# Patient Record
Sex: Female | Born: 1940 | Race: White | Hispanic: No | Smoking: Never smoker
Health system: Southern US, Community
[De-identification: ages and names within clinical notes are randomized; demographics above are authoritative.]

## PROBLEM LIST (undated history)

## (undated) DIAGNOSIS — M5116 Intervertebral disc disorders with radiculopathy, lumbar region: Secondary | ICD-10-CM

## (undated) DIAGNOSIS — I10 Essential (primary) hypertension: Secondary | ICD-10-CM

## (undated) DIAGNOSIS — H348112 Central retinal vein occlusion, right eye, stable: Secondary | ICD-10-CM

## (undated) DIAGNOSIS — K219 Gastro-esophageal reflux disease without esophagitis: Secondary | ICD-10-CM

## (undated) DIAGNOSIS — E039 Hypothyroidism, unspecified: Secondary | ICD-10-CM

## (undated) DIAGNOSIS — C801 Malignant (primary) neoplasm, unspecified: Secondary | ICD-10-CM

## (undated) DIAGNOSIS — R911 Solitary pulmonary nodule: Secondary | ICD-10-CM

## (undated) HISTORY — PX: BREAST EXCISIONAL BIOPSY: SUR124

## (undated) HISTORY — PX: COLONOSCOPY: SHX174

## (undated) HISTORY — PX: ESOPHAGOGASTRODUODENOSCOPY: SHX1529

## (undated) SURGERY — Surgical Case
Anesthesia: *Unknown

---

## 1988-12-31 HISTORY — PX: BREAST BIOPSY: SHX20

## 2005-06-25 ENCOUNTER — Ambulatory Visit: Payer: Self-pay | Admitting: Unknown Physician Specialty

## 2005-10-12 ENCOUNTER — Emergency Department: Payer: Self-pay | Admitting: General Practice

## 2012-12-31 HISTORY — PX: VIDEO ASSISTED THORACOSCOPY (VATS)/WEDGE RESECTION: SHX6174

## 2013-09-23 ENCOUNTER — Ambulatory Visit: Payer: Self-pay | Admitting: Oncology

## 2013-09-30 ENCOUNTER — Ambulatory Visit: Payer: Self-pay | Admitting: Oncology

## 2013-11-03 ENCOUNTER — Ambulatory Visit: Payer: Self-pay | Admitting: Oncology

## 2013-11-05 ENCOUNTER — Ambulatory Visit: Payer: Self-pay | Admitting: Oncology

## 2013-11-30 ENCOUNTER — Ambulatory Visit: Payer: Self-pay | Admitting: Oncology

## 2013-12-31 DIAGNOSIS — C801 Malignant (primary) neoplasm, unspecified: Secondary | ICD-10-CM

## 2013-12-31 HISTORY — DX: Malignant (primary) neoplasm, unspecified: C80.1

## 2015-07-25 DIAGNOSIS — M5116 Intervertebral disc disorders with radiculopathy, lumbar region: Secondary | ICD-10-CM

## 2015-07-25 HISTORY — DX: Intervertebral disc disorders with radiculopathy, lumbar region: M51.16

## 2016-04-02 ENCOUNTER — Ambulatory Visit
Admission: RE | Admit: 2016-04-02 | Payer: Medicare Other | Source: Ambulatory Visit | Admitting: Unknown Physician Specialty

## 2016-04-02 ENCOUNTER — Encounter: Admission: RE | Payer: Self-pay | Source: Ambulatory Visit

## 2016-04-02 SURGERY — COLONOSCOPY WITH PROPOFOL
Anesthesia: General

## 2016-05-09 ENCOUNTER — Encounter: Payer: Self-pay | Admitting: *Deleted

## 2016-05-10 ENCOUNTER — Ambulatory Visit
Admission: RE | Admit: 2016-05-10 | Discharge: 2016-05-10 | Disposition: A | Discharge status: Home / Self Care | Payer: Medicare Other | Source: Ambulatory Visit | Attending: Unknown Physician Specialty | Admitting: Unknown Physician Specialty

## 2016-05-10 ENCOUNTER — Encounter: Payer: Self-pay | Admitting: *Deleted

## 2016-05-10 ENCOUNTER — Ambulatory Visit: Payer: Medicare Other | Admitting: Anesthesiology

## 2016-05-10 ENCOUNTER — Encounter
Admission: RE | Disposition: A | Discharge status: Home / Self Care | Payer: Self-pay | Source: Ambulatory Visit | Attending: Unknown Physician Specialty

## 2016-05-10 DIAGNOSIS — K219 Gastro-esophageal reflux disease without esophagitis: Secondary | ICD-10-CM | POA: Insufficient documentation

## 2016-05-10 DIAGNOSIS — D123 Benign neoplasm of transverse colon: Secondary | ICD-10-CM | POA: Diagnosis not present

## 2016-05-10 DIAGNOSIS — Z7982 Long term (current) use of aspirin: Secondary | ICD-10-CM | POA: Insufficient documentation

## 2016-05-10 DIAGNOSIS — Z1211 Encounter for screening for malignant neoplasm of colon: Secondary | ICD-10-CM | POA: Diagnosis not present

## 2016-05-10 DIAGNOSIS — Z79899 Other long term (current) drug therapy: Secondary | ICD-10-CM | POA: Diagnosis not present

## 2016-05-10 DIAGNOSIS — I10 Essential (primary) hypertension: Secondary | ICD-10-CM | POA: Diagnosis not present

## 2016-05-10 DIAGNOSIS — K640 First degree hemorrhoids: Secondary | ICD-10-CM | POA: Insufficient documentation

## 2016-05-10 DIAGNOSIS — K621 Rectal polyp: Secondary | ICD-10-CM | POA: Insufficient documentation

## 2016-05-10 DIAGNOSIS — K5790 Diverticulosis of intestine, part unspecified, without perforation or abscess without bleeding: Secondary | ICD-10-CM | POA: Diagnosis not present

## 2016-05-10 DIAGNOSIS — E039 Hypothyroidism, unspecified: Secondary | ICD-10-CM | POA: Insufficient documentation

## 2016-05-10 HISTORY — DX: Gastro-esophageal reflux disease without esophagitis: K21.9

## 2016-05-10 HISTORY — DX: Intervertebral disc disorders with radiculopathy, lumbar region: M51.16

## 2016-05-10 HISTORY — PX: COLONOSCOPY WITH PROPOFOL: SHX5780

## 2016-05-10 HISTORY — DX: Essential (primary) hypertension: I10

## 2016-05-10 HISTORY — DX: Solitary pulmonary nodule: R91.1

## 2016-05-10 HISTORY — DX: Hypothyroidism, unspecified: E03.9

## 2016-05-10 HISTORY — DX: Central retinal vein occlusion, right eye, stable: H34.8112

## 2016-05-10 SURGERY — COLONOSCOPY WITH PROPOFOL
Anesthesia: General

## 2016-05-10 MED ORDER — PROPOFOL 10 MG/ML IV BOLUS
INTRAVENOUS | Status: DC | PRN
  Administered 2016-05-10: 80 mg via INTRAVENOUS

## 2016-05-10 MED ORDER — SODIUM CHLORIDE 0.9 % IV SOLN: INTRAVENOUS | Status: DC

## 2016-05-10 MED ORDER — FENTANYL CITRATE (PF) 100 MCG/2ML IJ SOLN
INTRAMUSCULAR | Status: DC | PRN
  Administered 2016-05-10: 50 ug via INTRAVENOUS

## 2016-05-10 MED ORDER — SODIUM CHLORIDE 0.9 % IV SOLN
INTRAVENOUS | Status: DC
  Administered 2016-05-10: 10:00:00 via INTRAVENOUS

## 2016-05-10 MED ORDER — LIDOCAINE HCL (CARDIAC) 20 MG/ML IV SOLN
INTRAVENOUS | Status: DC | PRN
  Administered 2016-05-10: 30 mg via INTRAVENOUS

## 2016-05-10 MED ORDER — MIDAZOLAM HCL 5 MG/5ML IJ SOLN
INTRAMUSCULAR | Status: DC | PRN
  Administered 2016-05-10: 1 mg via INTRAVENOUS

## 2016-05-10 MED ORDER — PROPOFOL 500 MG/50ML IV EMUL
INTRAVENOUS | Status: DC | PRN
  Administered 2016-05-10: 160 ug/kg/min via INTRAVENOUS

## 2016-05-10 NOTE — H&P (Signed)
Primary Care Physician:  Lenard Simmer, MD Primary Gastroenterologist:  Dr. Vira Agar  Pre-Procedure History & Physical: HPI:  Mckenzie Norton is a 76 y.o. female is here for an colonoscopy.   Past Medical History  Diagnosis Date  . 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 Hypertension   . Hypothyroidism   . Solitary pulmonary nodule   . GERD (gastroesophageal reflux disease)   . 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 Esophagogastroduodenoscopy    . Colonoscopy    . Video assisted thoracoscopy (vats)/wedge resection Left 2014    mature carcinoid tumor of the LUL    Prior to Admission medications   Medication Sig Start Date End Date Taking? Authorizing Provider  aspirin 325 MG tablet Take 325 mg by mouth daily.   Yes Historical Provider, MD  Calcium Carbonate-Vitamin D (CALTRATE 600+D PO) Take 600 mg by mouth 3 (three) times daily.   Yes Historical Provider, MD  esomeprazole (NEXIUM) 40 MG capsule Take 40 mg by mouth daily at 12 noon.   Yes Historical Provider, MD  levothyroxine (SYNTHROID, LEVOTHROID) 75 MCG tablet Take 75 mcg by mouth daily before breakfast.   Yes Historical Provider, MD  losartan-hydrochlorothiazide (HYZAAR) 50-12.5 MG tablet Take 1 tablet by mouth daily.   Yes Historical Provider, MD  metoprolol succinate (TOPROL-XL) 25 MG 24 hr tablet Take 12.5 mg by mouth daily.    Yes Historical Provider, MD  Multiple Vitamins-Minerals (CENTRUM SILVER PO) Take by mouth.   Yes Historical Provider, MD  omega-3 acid ethyl esters (LOVAZA) 1 g capsule Take 1 g by mouth 2 (two) times daily.   Yes Historical Provider, MD  rosuvastatin (CRESTOR) 10 MG tablet Take 10 mg by mouth daily.   Yes Historical Provider, MD    Allergies as of 04/30/2016  . (Not on File)    History reviewed. No pertinent family history.  Social History   Social History  . Marital Status: Married   Spouse Name: N/A  . Number of Children: N/A  . Years of Education: N/A   Occupational History  . Not on file.   Social History Main Topics  . Smoking status: Never Smoker   . Smokeless tobacco: Not on file  . Alcohol Use: 0.0 oz/week    0 Glasses of wine per week  . Drug Use: No  . Sexual Activity: Not on file   Other Topics Concern  . Not on file   Social History Narrative    Review of Systems: See HPI, otherwise negative ROS  Physical Exam: BP 123/60 mmHg  Pulse 71  Temp(Src) 97.4 F (36.3 C) (Tympanic)  Resp 16  Ht 5' (1.524 m)  Wt 70.308 kg (155 lb)  BMI 30.27 kg/m2  SpO2 100% General:   Alert,  pleasant and cooperative in NAD Head:  Normocephalic and atraumatic. Neck:  Supple; no masses or thyromegaly. Lungs:  Clear throughout to auscultation.    Heart:  Regular rate and rhythm. Abdomen:  Soft, nontender and nondistended. Normal bowel sounds, without guarding, and without rebound.   Neurologic:  Alert and  oriented x4;  grossly normal neurologically.  Impression/Plan: Mckenzie Norton is here for an colonoscopy to be performed for screening  Risks, benefits, limitations, and alternatives regarding  colonoscopy have been reviewed with the patient.  Questions have been answered.  All parties agreeable.   Gaylyn Cheers, MD  05/10/2016, 10:16 AM

## 2016-05-10 NOTE — Anesthesia Postprocedure Evaluation (Signed)
Anesthesia Post Note  Patient: Mckenzie Norton  Procedure(s) Performed: Procedure(s) (LRB): COLONOSCOPY WITH PROPOFOL (N/A)  Patient location during evaluation: Endoscopy Anesthesia Type: General Level of consciousness: awake and alert Pain management: pain level controlled Vital Signs Assessment: post-procedure vital signs reviewed and stable Respiratory status: spontaneous breathing, nonlabored ventilation, respiratory function stable and patient connected to nasal cannula oxygen Cardiovascular status: blood pressure returned to baseline and stable Postop Assessment: no signs of nausea or vomiting Anesthetic complications: no    Last Vitals:  Filed Vitals:   05/10/16 1100 05/10/16 1110  BP: 109/63 125/68  Pulse: 69 64  Temp:    Resp: 16 13    Last Pain: There were no vitals filed for this visit.               Martha Clan

## 2016-05-10 NOTE — Op Note (Signed)
[facility] Patient Name: Mckenzie Norton Procedure Date: 05/10/2016 10:13 AM MRN: [id number] Account #: [id number] Date of Birth: [birth year] Admit Type: Outpatient Age: 76 Room: [facility] ENDO ROOM 1 Gender: Female Note Status: Finalized Procedure:            Colonoscopy Indications:          Screening for colorectal malignant neoplasm Providers:            Manya Silvas, MD Referring MD:         Lenard Simmer, MD (Referring MD) Medicines:            Propofol per Anesthesia Complications:        No immediate complications. Procedure:            Pre-Anesthesia Assessment:                       - After reviewing the risks and benefits, the patient                        was deemed in satisfactory condition to undergo the                        procedure.                       After obtaining informed consent, the colonoscope was                        passed under direct vision. Throughout the procedure,                        the patient's blood pressure, pulse, and oxygen                        saturations were monitored continuously. The                        Colonoscope was introduced through the anus and                        advanced to the the cecum, identified by appendiceal                        orifice and ileocecal valve. The colonoscopy was                        performed without difficulty. The patient tolerated the                        procedure well. The quality of the bowel preparation                        was excellent. Findings:      Two sessile polyps were found in the rectum and transverse colon. The       polyps were diminutive in size. These polyps were removed with a jumbo       cold forceps. Resection and retrieval were complete.      Internal hemorrhoids were found during endoscopy. The hemorrhoids were       small and Grade I (internal hemorrhoids that do not prolapse).  A few small-mouthed diverticula were  found in the sigmoid colon.      The exam was otherwise without abnormality. Impression:           - Two diminutive polyps in the rectum and in the                        transverse colon, removed with a jumbo cold forceps.                        Resected and retrieved.                       - Internal hemorrhoids.                       - Diverticulosis in the sigmoid colon.                       - The examination was otherwise normal. Recommendation:       - Await pathology results. Manya Silvas, MD 05/10/2016 10:40:06 AM This report has been signed electronically. Number of Addenda: 0 Note Initiated On: 05/10/2016 10:13 AM Scope Withdrawal Time: 0 hours 8 minutes 55 seconds  Total Procedure Duration: 0 hours 14 minutes 16 seconds       [facility]

## 2016-05-10 NOTE — Anesthesia Preprocedure Evaluation (Signed)
Anesthesia Evaluation  Patient identified by MRN, date of birth, ID band Patient awake    Reviewed: Allergy & Precautions, H&P , NPO status , Patient's Chart, lab work & pertinent test results, reviewed documented beta blocker date and time   History of Anesthesia Complications Negative for: history of anesthetic complications  Airway Mallampati: II  TM Distance: >3 FB Neck ROM: full    Dental no notable dental hx. (+) Caps, Teeth Intact, Missing   Pulmonary neg pulmonary ROS,    Pulmonary exam normal breath sounds clear to auscultation       Cardiovascular Exercise Tolerance: Good hypertension, On Medications (-) angina(-) CAD, (-) Past MI, (-) Cardiac Stents and (-) CABG Normal cardiovascular exam(-) dysrhythmias (-) Valvular Problems/Murmurs Rhythm:regular Rate:Normal     Neuro/Psych negative neurological ROS  negative psych ROS   GI/Hepatic Neg liver ROS, GERD  ,  Endo/Other  neg diabetesHypothyroidism   Renal/GU negative Renal ROS  negative genitourinary   Musculoskeletal   Abdominal   Peds  Hematology negative hematology ROS (+)   Anesthesia Other Findings Past Medical History:   Intervertebral disc disorder with radiculopath* 07/25/15      Hypertension                                                 Hypothyroidism                                               Solitary pulmonary nodule                                    GERD (gastroesophageal reflux disease)                       Reproductive/Obstetrics negative OB ROS                             Anesthesia Physical Anesthesia Plan  ASA: II  Anesthesia Plan: General   Post-op Pain Management:    Induction:   Airway Management Planned:   Additional Equipment:   Intra-op Plan:   Post-operative Plan:   Informed Consent: I have reviewed the patients History and Physical, chart, labs and discussed the procedure including  the risks, benefits and alternatives for the proposed anesthesia with the patient or authorized representative who has indicated his/her understanding and acceptance.   Dental Advisory Given  Plan Discussed with: Anesthesiologist, CRNA and Surgeon  Anesthesia Plan Comments:         Anesthesia Quick Evaluation

## 2016-05-10 NOTE — Transfer of Care (Signed)
Immediate Anesthesia Transfer of Care Note  Patient: Mckenzie Norton  Procedure(s) Performed: Procedure(s): COLONOSCOPY WITH PROPOFOL (N/A)  Patient Location: PACU and Endoscopy Unit  Anesthesia Type:General  Level of Consciousness: sedated and patient cooperative  Airway & Oxygen Therapy: Patient Spontanous Breathing and Patient connected to nasal cannula oxygen  Post-op Assessment: Report given to RN and Post -op Vital signs reviewed and stable  Post vital signs: Reviewed and stable  Last Vitals:  Filed Vitals:   05/10/16 0941  BP: 123/60  Pulse: 71  Temp: 36.3 C  Resp: 16    Last Pain: There were no vitals filed for this visit.       Complications: No apparent anesthesia complications

## 2016-05-11 LAB — SURGICAL PATHOLOGY

## 2016-05-12 ENCOUNTER — Encounter: Payer: Self-pay | Admitting: Unknown Physician Specialty

## 2017-08-03 ENCOUNTER — Encounter: Payer: Self-pay | Admitting: Gynecology

## 2017-08-03 ENCOUNTER — Ambulatory Visit
Admission: EM | Admit: 2017-08-03 | Discharge: 2017-08-03 | Disposition: A | Discharge status: Home / Self Care | Payer: Medicare Other | Attending: Family Medicine | Admitting: Family Medicine

## 2017-08-03 DIAGNOSIS — Z7982 Long term (current) use of aspirin: Secondary | ICD-10-CM | POA: Diagnosis not present

## 2017-08-03 DIAGNOSIS — Z79899 Other long term (current) drug therapy: Secondary | ICD-10-CM | POA: Diagnosis not present

## 2017-08-03 DIAGNOSIS — I10 Essential (primary) hypertension: Secondary | ICD-10-CM | POA: Diagnosis not present

## 2017-08-03 DIAGNOSIS — E039 Hypothyroidism, unspecified: Secondary | ICD-10-CM | POA: Insufficient documentation

## 2017-08-03 DIAGNOSIS — K219 Gastro-esophageal reflux disease without esophagitis: Secondary | ICD-10-CM | POA: Diagnosis not present

## 2017-08-03 DIAGNOSIS — R300 Dysuria: Secondary | ICD-10-CM

## 2017-08-03 DIAGNOSIS — N390 Urinary tract infection, site not specified: Secondary | ICD-10-CM | POA: Diagnosis present

## 2017-08-03 DIAGNOSIS — Z881 Allergy status to other antibiotic agents status: Secondary | ICD-10-CM | POA: Diagnosis not present

## 2017-08-03 LAB — URINALYSIS, COMPLETE (UACMP) WITH MICROSCOPIC: Squamous Epithelial / LPF: NONE SEEN

## 2017-08-03 MED ORDER — CEPHALEXIN 500 MG PO CAPS: 500.0000 mg | ORAL_CAPSULE | Freq: Two times a day (BID) | ORAL | 0 refills | Status: AC

## 2017-08-03 NOTE — ED Triage Notes (Signed)
Patient c/o frequent and painful urination.

## 2017-08-03 NOTE — ED Provider Notes (Signed)
[facility] ____________________________________________  Time seen: Approximately 10:19 AM  I have reviewed the triage vital signs and the nursing notes.   HISTORY  Chief Complaint Urinary Tract Infection   HPI Mckenzie Norton is a 77 y.o. female presenting for evaluation of frequent urination and burning with urination that has been present for the last 2 days. Patient reports she did take over-the-counter urinary burning released medications which did help with symptoms. Reports that she does have some baseline urinary incontinence and uses painting liners, denies any acute changes in this. Reports has had some loose stool yesterday, denies continued loose stool, denies hematochezia, melena or abnormal colors stool. Reports some lower abdominal pressure and cramping sensation, but reports of an otherwise feels well. Reports has continued to eat and drink well. Denies attempting fevers, back pain, nausea, vomiting or other complaints. Reports does feel similar to previous urinary tract infections. Denies any recent UTI. Denies any vaginal complaints. Denies known trigger.Denies chest pain, shortness of breath, abdominal pain, dysuria, extremity pain, extremity swelling or rash. Denies recent sickness. Denies recent antibiotic use.   Morayati, Lourdes Sledge, MD: PCP   Past Medical History:  Diagnosis Date  . 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 GERD (gastroesophageal reflux disease)   . Hypertension   . Hypothyroidism   . 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 Solitary pulmonary nodule     There are no active problems to display for this patient.   Past Surgical History:  Procedure Laterality Date  . COLONOSCOPY    . COLONOSCOPY WITH PROPOFOL N/A 05/10/2016   Procedure: COLONOSCOPY WITH PROPOFOL;  Surgeon: Manya Silvas, MD;  Location[facility] ENDOSCOPY;  Service: Endoscopy;  Laterality: N/A;  . ESOPHAGOGASTRODUODENOSCOPY     . VIDEO ASSISTED THORACOSCOPY (VATS)/WEDGE RESECTION Left 2014   mature carcinoid tumor of the LUL     No current facility-administered medications for this encounter.   Current Outpatient Prescriptions:  .  aspirin 325 MG tablet, Take 325 mg by mouth daily., Disp: , Rfl:  .  Calcium Carbonate-Vitamin D (CALTRATE 600+D PO), Take 600 mg by mouth 3 (three) times daily., Disp: , Rfl:  .  esomeprazole (NEXIUM) 40 MG capsule, Take 40 mg by mouth daily at 12 noon., Disp: , Rfl:  .  levothyroxine (SYNTHROID, LEVOTHROID) 75 MCG tablet, Take 75 mcg by mouth daily before breakfast., Disp: , Rfl:  .  losartan-hydrochlorothiazide (HYZAAR) 50-12.5 MG tablet, Take 1 tablet by mouth daily., Disp: , Rfl:  .  metoprolol succinate (TOPROL-XL) 25 MG 24 hr tablet, Take 12.5 mg by mouth daily. , Disp: , Rfl:  .  Multiple Vitamins-Minerals (CENTRUM SILVER PO), Take by mouth., Disp: , Rfl:  .  omega-3 acid ethyl esters (LOVAZA) 1 g capsule, Take 1 g by mouth 2 (two) times daily., Disp: , Rfl:  .  rosuvastatin (CRESTOR) 10 MG tablet, Take 10 mg by mouth daily., Disp: , Rfl:  .  cephALEXin (KEFLEX) 500 MG capsule, Take 1 capsule (500 mg total) by mouth 2 (two) times daily., Disp: 14 capsule, Rfl: 0  Allergies Levofloxacin and Adhesive [tape]  No family history on file.  Social History Social History  Substance Use Topics  . Smoking status: Never Smoker  . Smokeless tobacco: Never Used  . Alcohol use 0.0 oz/week    Review of Systems Constitutional: No fever/chills Cardiovascular: Denies chest pain. Respiratory: Denies shortness of breath. Gastrointestinal: No abdominal pain.  No nausea, no vomiting.  Genitourinary: Positive for dysuria. Musculoskeletal: Negative for back pain. Skin: Negative for rash.  ____________________________________________   PHYSICAL EXAM:  VITAL SIGNS: ED Triage Vitals  Enc Vitals Group     BP 08/03/17 0907 113/63     Pulse Rate 08/03/17 0907 63     Resp 08/03/17  0907 16     Temp 08/03/17 0907 97.8 F (36.6 C)     Temp Source 08/03/17 0907 Oral     SpO2 08/03/17 0907 98 %     Weight 08/03/17 0912 153 lb (69.4 kg)     Height 08/03/17 0912 5' (1.524 m)     Head Circumference --      Peak Flow --      Pain Score 08/03/17 0912 3     Pain Loc --      Pain Edu? --      Excl. in [place]? --     Constitutional: Alert and oriented. Well appearing and in no acute distress.. Cardiovascular: Normal rate, regular rhythm. Grossly normal heart sounds.  Good peripheral circulation. Respiratory: Normal respiratory effort without tachypnea nor retractions. Breath sounds are clear and equal bilaterally. No wheezes, rales, rhonchi. Gastrointestinal:  minimal suprapubic tenderness, abdomen otherwise soft and nontender. No distention. Normal Bowel sounds. No CVA tenderness. Musculoskeletal:  No midline cervical, thoracic or lumbar tenderness to palpation. Steady gait.  Neurologic:  Normal speech and language.Speech is normal. No gait instability.  Skin:  Skin is warm, dry. Psychiatric: Mood and affect are normal. Speech and behavior are normal. Patient exhibits appropriate insight and judgment   ___________________________________________   LABS (all labs ordered are listed, but only abnormal results are displayed)  Labs Reviewed  URINALYSIS, COMPLETE (UACMP) WITH MICROSCOPIC - Abnormal; Notable for the following:       Result Value   Color, Urine RED (*)    APPearance CLOUDY (*)    Glucose, UA   (*)    Value: TEST NOT REPORTED DUE TO COLOR INTERFERENCE OF URINE PIGMENT   Hgb urine dipstick   (*)    Value: TEST NOT REPORTED DUE TO COLOR INTERFERENCE OF URINE PIGMENT   Bilirubin Urine   (*)    Value: TEST NOT REPORTED DUE TO COLOR INTERFERENCE OF URINE PIGMENT   Ketones, ur   (*)    Value: TEST NOT REPORTED DUE TO COLOR INTERFERENCE OF URINE PIGMENT   Protein, ur   (*)    Value: TEST NOT REPORTED DUE TO COLOR INTERFERENCE OF URINE PIGMENT   Nitrite   (*)     Value: TEST NOT REPORTED DUE TO COLOR INTERFERENCE OF URINE PIGMENT   Leukocytes, UA   (*)    Value: TEST NOT REPORTED DUE TO COLOR INTERFERENCE OF URINE PIGMENT   Bacteria, UA FEW (*)    All other components within normal limits  URINE CULTURE    PROCEDURES Procedures    INITIAL IMPRESSION / ASSESSMENT AND PLAN / ED COURSE  Pertinent labs & imaging results that were available during my care of the patient were reviewed by me and considered in my medical decision making (see chart for details).  Well-appearing patient. No acute distress. Urinalysis reviewed, discussed in detail with patient concern for urinary tract infection. Will culture urine. Will treat with oral Keflex. Encouraged rest, fluids and supportive care. Encourage PCP follow up. Discussed indication, risks and benefits of medications with patient.   Discussed follow up and return parameters including no resolution or any worsening concerns. Patient verbalized understanding and agreed to  plan.   ____________________________________________   FINAL CLINICAL IMPRESSION(S) / ED DIAGNOSES  Final diagnoses:  Urinary tract infection without hematuria, site unspecified     Discharge Medication List as of 08/03/2017  9:58 AM    START taking these medications   Details  cephALEXin (KEFLEX) 500 MG capsule Take 1 capsule (500 mg total) by mouth 2 (two) times daily., Starting Sat 08/03/2017, Until Sat 08/10/2017, Normal        Note: This dictation was prepared with Dragon dictation along with smaller phrase technology. Any transcriptional errors that result from this process are unintentional.         Marylene Land, NP 08/03/17 1118

## 2017-08-03 NOTE — Discharge Instructions (Signed)
Take medication as prescribed. Rest. Drink plenty of fluids.  ° °Follow up with your primary care physician this week as needed. Return to Urgent care for new or worsening concerns.  ° °

## 2017-08-05 LAB — URINE CULTURE

## 2017-08-26 ENCOUNTER — Encounter: Payer: Self-pay | Admitting: Emergency Medicine

## 2017-08-26 ENCOUNTER — Ambulatory Visit
Admission: EM | Admit: 2017-08-26 | Discharge: 2017-08-26 | Disposition: A | Discharge status: Home / Self Care | Payer: Medicare Other | Attending: Family Medicine | Admitting: Family Medicine

## 2017-08-26 DIAGNOSIS — Z881 Allergy status to other antibiotic agents status: Secondary | ICD-10-CM | POA: Diagnosis not present

## 2017-08-26 DIAGNOSIS — R350 Frequency of micturition: Secondary | ICD-10-CM | POA: Diagnosis present

## 2017-08-26 DIAGNOSIS — K219 Gastro-esophageal reflux disease without esophagitis: Secondary | ICD-10-CM | POA: Insufficient documentation

## 2017-08-26 DIAGNOSIS — J018 Other acute sinusitis: Secondary | ICD-10-CM

## 2017-08-26 DIAGNOSIS — Z79899 Other long term (current) drug therapy: Secondary | ICD-10-CM | POA: Insufficient documentation

## 2017-08-26 DIAGNOSIS — I10 Essential (primary) hypertension: Secondary | ICD-10-CM | POA: Diagnosis not present

## 2017-08-26 DIAGNOSIS — E039 Hypothyroidism, unspecified: Secondary | ICD-10-CM | POA: Insufficient documentation

## 2017-08-26 DIAGNOSIS — N393 Stress incontinence (female) (male): Secondary | ICD-10-CM

## 2017-08-26 DIAGNOSIS — Z7982 Long term (current) use of aspirin: Secondary | ICD-10-CM | POA: Insufficient documentation

## 2017-08-26 DIAGNOSIS — R059 Cough, unspecified: Secondary | ICD-10-CM

## 2017-08-26 DIAGNOSIS — R05 Cough: Secondary | ICD-10-CM

## 2017-08-26 DIAGNOSIS — N390 Urinary tract infection, site not specified: Secondary | ICD-10-CM | POA: Diagnosis not present

## 2017-08-26 LAB — URINALYSIS, COMPLETE (UACMP) WITH MICROSCOPIC
BILIRUBIN URINE: NEGATIVE
GLUCOSE, UA: NEGATIVE mg/dL
Ketones, ur: NEGATIVE mg/dL
NITRITE: NEGATIVE
PROTEIN: NEGATIVE mg/dL
SQUAMOUS EPITHELIAL / LPF: NONE SEEN
Specific Gravity, Urine: 1.01 (ref 1.005–1.030)
pH: 6 (ref 5.0–8.0)

## 2017-08-26 MED ORDER — AMOXICILLIN-POT CLAVULANATE 875-125 MG PO TABS: 1.0000 | ORAL_TABLET | Freq: Two times a day (BID) | ORAL | 0 refills | Status: DC

## 2017-08-26 MED ORDER — LORATADINE-PSEUDOEPHEDRINE ER 10-240 MG PO TB24: 1.0000 | ORAL_TABLET | Freq: Every day | ORAL | 0 refills | Status: DC

## 2017-08-26 MED ORDER — HYDROCOD POLST-CPM POLST ER 10-8 MG/5ML PO SUER: 5.0000 mL | Freq: Two times a day (BID) | ORAL | 0 refills | Status: DC | PRN

## 2017-08-26 NOTE — ED Triage Notes (Signed)
Patient also reports having urinary frequency for the past 2 days.

## 2017-08-26 NOTE — ED Triage Notes (Signed)
Patient c/o cough and chest congestion for a week.  

## 2017-08-26 NOTE — ED Provider Notes (Signed)
[facility]    CSN: [id number] Arrival date & time: 08/26/17  1416     History   Chief Complaint Chief Complaint  Patient presents with  . Cough  . Urinary Frequency    HPI Mckenzie Norton is a 77 y.o. female.   Patient is a 77 year old female who's had a cough for about a week. She states the cough is progressively gotten worse. 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 She states she gets yearly x-rays and had x-rayed the spring which was fine. She is now coughing cough is gotten worse and is visit to tickle down her throat. Along with the coughing she's also notes some incontinence and she initially thought the incontinence was coming from the coughing as such with passive night during the day but the last 24 hours she is alsoirritation around the urethra making herself concerned that that may have been more in the coughing causing incontinence she's had a UTI before which she does not feel there was a very pleasant experience. 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 Along with lung resection she's had upper GI colonoscopy as well. She is allergic to Levaquin. She does not smoke.   The history is provided by the patient. No language interpreter was used.  Cough  Cough characteristics:  Non-productive, hacking and productive Sputum characteristics:  Nondescript Severity:  Severe Onset quality:  Sudden Duration:  6 days Timing:  Constant Progression:  Worsening Chronicity:  New Context: upper respiratory infection   Relieved by:  Nothing Worsened by:  Activity Ineffective treatments:  Rest and cough suppressants Associated symptoms: headaches, rhinorrhea and sinus congestion   Associated symptoms: no chest pain, no chills, no fever, no rash, no weight loss and no wheezing   Urinary Frequency  Associated symptoms include headaches.  Pertinent negatives include no chest pain.    Past Medical History:  Diagnosis Date  . 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 GERD (gastroesophageal reflux disease)   . Hypertension   . Hypothyroidism   . 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 Solitary pulmonary nodule     There are no active problems to display for this patient.   Past Surgical History:  Procedure Laterality Date  . COLONOSCOPY    . COLONOSCOPY WITH PROPOFOL N/A 05/10/2016   Procedure: COLONOSCOPY WITH PROPOFOL;  Surgeon: Manya Silvas, MD;  Location: [facility] ENDOSCOPY;  Service: Endoscopy;  Laterality: N/A;  . ESOPHAGOGASTRODUODENOSCOPY    . VIDEO ASSISTED THORACOSCOPY (VATS)/WEDGE RESECTION Left 2014   mature carcinoid tumor of the LUL    OB History    No data available       Home Medications    Prior to Admission medications   Medication Sig Norton Date End Date Taking? Authorizing Provider  amoxicillin-clavulanate (AUGMENTIN) 875-125 MG tablet Take 1 tablet by mouth 2 (two) times daily. 08/26/17   Frederich Cha, MD  aspirin 325 MG tablet Take 325 mg by mouth daily.    [provider]  Calcium Carbonate-Vitamin D (CALTRATE 600+D PO) Take 600 mg by mouth 3 (three) times daily.    [provider]  chlorpheniramine-HYDROcodone (TUSSIONEX PENNKINETIC ER) 10-8 MG/5ML SUER Take 5 mLs by mouth every 12 (twelve) hours as needed. 08/26/17   Frederich Cha, MD  esomeprazole (NEXIUM) 40 MG capsule Take 40 mg by mouth daily  at 12 noon.    [provider]  levothyroxine (SYNTHROID, LEVOTHROID) 75 MCG tablet Take 75 mcg by mouth daily before breakfast.    [provider]  loratadine-pseudoephedrine (CLARITIN-D 24 HOUR) 10-240 MG 24 hr tablet Take 1 tablet by mouth daily. 08/26/17   Frederich Cha, MD  losartan-hydrochlorothiazide (HYZAAR) 50-12.5 MG tablet Take 1 tablet by mouth daily.    [provider]  metoprolol succinate  (TOPROL-XL) 25 MG 24 hr tablet Take 12.5 mg by mouth daily.     [provider]  Multiple Vitamins-Minerals (CENTRUM SILVER PO) Take by mouth.    [provider]  omega-3 acid ethyl esters (LOVAZA) 1 g capsule Take 1 g by mouth 2 (two) times daily.    [provider]  rosuvastatin (CRESTOR) 10 MG tablet Take 10 mg by mouth daily.    [provider]    Family History History reviewed. No pertinent family history.  Social History Social History  Substance Use Topics  . Smoking status: Never Smoker  . Smokeless tobacco: Never Used  . Alcohol use 0.0 oz/week     Allergies   Levofloxacin and Adhesive [tape]   Review of Systems Review of Systems  Constitutional: Negative for chills, fever and weight loss.  HENT: Positive for rhinorrhea.   Respiratory: Positive for cough. Negative for wheezing.   Cardiovascular: Negative for chest pain.  Genitourinary: Positive for frequency.  Skin: Negative for rash.  Neurological: Positive for headaches.  All other systems reviewed and are negative.    Physical Exam Triage Vital Signs ED Triage Vitals  Enc Vitals Group     BP 08/26/17 1443 120/73     Pulse Rate 08/26/17 1443 74     Resp 08/26/17 1443 14     Temp 08/26/17 1443 98.2 F (36.8 C)     Temp Source 08/26/17 1443 Oral     SpO2 08/26/17 1443 97 %     Weight 08/26/17 1441 153 lb (69.4 kg)     Height --      Head Circumference --      Peak Flow --      Pain Score 08/26/17 1441 0     Pain Loc --      Pain Edu? --      Excl. in [place]? --    No data found.   Updated Vital Signs BP 120/73 (BP Location: Left Arm)   Pulse 74   Temp 98.2 F (36.8 C) (Oral)   Resp 14   Wt 153 lb (69.4 kg)   SpO2 97%   BMI 29.88 kg/m   Visual Acuity Right Eye Distance:   Left Eye Distance:   Bilateral Distance:    Right Eye Near:   Left Eye Near:    Bilateral Near:     Physical Exam  Constitutional: She is oriented to person, place, and time. She  appears well-developed and well-nourished.  HENT:  Head: Normocephalic.  Right Ear: External ear normal.  Left Ear: External ear normal.  Mouth/Throat: Oropharynx is clear and moist.  Eyes: Pupils are equal, round, and reactive to light.  Neck: Normal range of motion. Neck supple.  Cardiovascular: Normal rate and regular rhythm.   Pulmonary/Chest: Effort normal and breath sounds normal.  Musculoskeletal: Normal range of motion.  Neurological: She is alert and oriented to person, place, and time.  Skin: Skin is warm.  Psychiatric: She has a normal mood and affect.  Vitals reviewed.    UC Treatments / Results  Labs (all labs ordered are listed, but only abnormal results are displayed) Labs Reviewed  URINALYSIS, COMPLETE (UACMP) WITH MICROSCOPIC - Abnormal; Notable for the following:       Result Value   Hgb urine dipstick TRACE (*)    Leukocytes, UA SMALL (*)    Bacteria, UA RARE (*)    All other components within normal limits  URINE CULTURE    EKG  EKG Interpretation None       Radiology No results found.  Procedures Procedures (including critical care time)  Medications Ordered in UC Medications - No data to display  Results for orders placed or performed during the hospital encounter of 08/26/17  Urinalysis, Complete w Microscopic  Result Value Ref Range   Color, Urine YELLOW YELLOW   APPearance CLEAR CLEAR   Specific Gravity, Urine 1.010 1.005 - 1.030   pH 6.0 5.0 - 8.0   Glucose, UA NEGATIVE NEGATIVE mg/dL   Hgb urine dipstick TRACE (A) NEGATIVE   Bilirubin Urine NEGATIVE NEGATIVE   Ketones, ur NEGATIVE NEGATIVE mg/dL   Protein, ur NEGATIVE NEGATIVE mg/dL   Nitrite NEGATIVE NEGATIVE   Leukocytes, UA SMALL (A) NEGATIVE   Squamous Epithelial / LPF NONE SEEN NONE SEEN   WBC, UA 6-30 0 - 5 WBC/hpf   RBC / HPF 0-5 0 - 5 RBC/hpf   Bacteria, UA RARE (A) NONE SEEN   Initial Impression / Assessment and Plan / UC Course  I have reviewed the triage vital  signs and the nursing notes.  Pertinent labs & imaging results that were available during my care of the patient were reviewed by me and considered in my medical decision making (see chart for details).   will place on Tussionex 1 teaspoon twice a day Augmentin 875 one tablet twice a day hold off on any other medication for urinary incontinence and UTI unless culture shows she needs a different antibiotic will place on Claritin-D 1 tablet a day for the sinus infection   Final Clinical Impressions(s) / UC Diagnoses   Final diagnoses:  Cough  Urinary, incontinence, stress female  Urinary tract infection without hematuria, site unspecified  Other acute sinusitis, recurrence not specified    New Prescriptions New Prescriptions   AMOXICILLIN-CLAVULANATE (AUGMENTIN) 875-125 MG TABLET    Take 1 tablet by mouth 2 (two) times daily.   CHLORPHENIRAMINE-HYDROCODONE (TUSSIONEX PENNKINETIC ER) 10-8 MG/5ML SUER    Take 5 mLs by mouth every 12 (twelve) hours as needed.   LORATADINE-PSEUDOEPHEDRINE (CLARITIN-D 24 HOUR) 10-240 MG 24 HR TABLET    Take 1 tablet by mouth daily.   Note: This dictation was prepared with Dragon dictation along with smaller phrase technology. Any transcriptional errors that result from this process are unintentional. Controlled Substance Prescriptions [place] Controlled Substance Registry consulted? Not Applicable   Frederich Cha, MD 08/26/17 [phone]

## 2017-08-29 LAB — URINE CULTURE: Special Requests: NORMAL

## 2017-09-05 ENCOUNTER — Telehealth: Payer: Self-pay

## 2017-09-05 NOTE — Telephone Encounter (Signed)
Pt returns our call after receiving letter in the mail. I reported her urine cx results and she will f/u as needed with PCP.

## 2017-11-05 ENCOUNTER — Other Ambulatory Visit: Payer: Self-pay | Admitting: Obstetrics and Gynecology

## 2017-11-05 DIAGNOSIS — Z1231 Encounter for screening mammogram for malignant neoplasm of breast: Secondary | ICD-10-CM

## 2017-12-02 ENCOUNTER — Other Ambulatory Visit: Payer: Self-pay | Admitting: Obstetrics and Gynecology

## 2017-12-02 ENCOUNTER — Ambulatory Visit
Admission: RE | Admit: 2017-12-02 | Discharge: 2017-12-02 | Disposition: A | Discharge status: Home / Self Care | Payer: Medicare Other | Source: Ambulatory Visit | Attending: Obstetrics and Gynecology | Admitting: Obstetrics and Gynecology

## 2017-12-02 ENCOUNTER — Other Ambulatory Visit: Payer: Self-pay

## 2017-12-02 ENCOUNTER — Ambulatory Visit
Admission: EM | Admit: 2017-12-02 | Discharge: 2017-12-02 | Disposition: A | Discharge status: Home / Self Care | Payer: Medicare Other | Attending: Family Medicine | Admitting: Family Medicine

## 2017-12-02 DIAGNOSIS — R319 Hematuria, unspecified: Secondary | ICD-10-CM | POA: Insufficient documentation

## 2017-12-02 DIAGNOSIS — Z1231 Encounter for screening mammogram for malignant neoplasm of breast: Secondary | ICD-10-CM | POA: Diagnosis not present

## 2017-12-02 DIAGNOSIS — N390 Urinary tract infection, site not specified: Secondary | ICD-10-CM | POA: Diagnosis not present

## 2017-12-02 DIAGNOSIS — R350 Frequency of micturition: Secondary | ICD-10-CM | POA: Diagnosis present

## 2017-12-02 HISTORY — DX: Malignant (primary) neoplasm, unspecified: C80.1

## 2017-12-02 LAB — URINALYSIS, COMPLETE (UACMP) WITH MICROSCOPIC
Bilirubin Urine: NEGATIVE
GLUCOSE, UA: NEGATIVE mg/dL
Ketones, ur: NEGATIVE mg/dL
Nitrite: POSITIVE — AB
PH: 7 (ref 5.0–8.0)
Protein, ur: NEGATIVE mg/dL
Specific Gravity, Urine: 1.01 (ref 1.005–1.030)

## 2017-12-02 MED ORDER — CEPHALEXIN 500 MG PO CAPS: 500.0000 mg | ORAL_CAPSULE | Freq: Two times a day (BID) | ORAL | 0 refills | Status: AC

## 2017-12-02 NOTE — ED Provider Notes (Signed)
[facility] ____________________________________________  Time seen: Approximately 11:23 AM  I have reviewed the triage vital signs and the nursing notes.   HISTORY  Chief Complaint Urinary Frequency  HPI Mckenzie Norton is a 77 y.o. female presenting for evaluation of 2 3 days of urinary frequency, urinary urgency and some burning with urination.  Patient reports previous UTIs with these symptoms.  Patient states last UTI was in August.  Patient reports that she does have some baseline intermittent incontinence, and states that this continues, without change.  Denies bowel changes.  Denies accompanying fevers, back pain or upper abdominal pain.  States intermittent low suprapubic pressure and sensation of needing to urinate.  Reports continues to eat and drink well.  States may not have drink as much water this past week as she normally does.  Denies any other known triggers.  Denies vaginal complaints or vaginal bulge.  Has not been seen by urology for the same complaints.  Denies other aggravating or alleviating factors and reports otherwise feels well.Denies chest pain, shortness of breath, abdominal pain, or rash. Denies recent sickness.    Past Medical History:  Diagnosis Date  . 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 GERD (gastroesophageal reflux disease)   . Hypertension   . Hypothyroidism   . 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 Solitary pulmonary nodule     There are no active problems to display for this patient.   Past Surgical History:  Procedure Laterality Date  . COLONOSCOPY    . COLONOSCOPY WITH PROPOFOL N/A 05/10/2016   Procedure: COLONOSCOPY WITH PROPOFOL;  Surgeon: Manya Silvas, MD;  Location: [facility] ENDOSCOPY;  Service: Endoscopy;  Laterality: N/A;  . ESOPHAGOGASTRODUODENOSCOPY    . VIDEO ASSISTED THORACOSCOPY (VATS)/WEDGE RESECTION Left 2014   mature carcinoid tumor of the LUL     No current  facility-administered medications for this encounter.   Current Outpatient Medications:  .  aspirin 325 MG tablet, Take 325 mg by mouth daily., Disp: , Rfl:  .  Calcium Carbonate-Vitamin D (CALTRATE 600+D PO), Take 600 mg by mouth 3 (three) times daily., Disp: , Rfl:  .  esomeprazole (NEXIUM) 40 MG capsule, Take 40 mg by mouth daily at 12 noon., Disp: , Rfl:  .  levothyroxine (SYNTHROID, LEVOTHROID) 75 MCG tablet, Take 75 mcg by mouth daily before breakfast., Disp: , Rfl:  .  losartan-hydrochlorothiazide (HYZAAR) 50-12.5 MG tablet, Take 1 tablet by mouth daily., Disp: , Rfl:  .  metoprolol succinate (TOPROL-XL) 25 MG 24 hr tablet, Take 12.5 mg by mouth daily. , Disp: , Rfl:  .  Multiple Vitamins-Minerals (CENTRUM SILVER PO), Take by mouth., Disp: , Rfl:  .  omega-3 acid ethyl esters (LOVAZA) 1 g capsule, Take 1 g by mouth 2 (two) times daily., Disp: , Rfl:  .  rosuvastatin (CRESTOR) 10 MG tablet, Take 10 mg by mouth daily., Disp: , Rfl:  .  cephALEXin (KEFLEX) 500 MG capsule, Take 1 capsule (500 mg total) by mouth 2 (two) times daily for 7 days., Disp: 14 capsule, Rfl: 0  Allergies Levofloxacin and Adhesive [tape]  Family History  Problem Relation Age of Onset  . Lung cancer Mother   . Heart disease Mother     Social History Social History   Tobacco Use  . Smoking status: Never Smoker  . Smokeless tobacco: Never Used  Substance Use Topics  . Alcohol use: Yes    Alcohol/week: 0.0 oz  Frequency: Never    Comment: rare  . Drug use: No    Review of Systems Constitutional: No fever/chills Cardiovascular: Denies chest pain. Respiratory: Denies shortness of breath. Gastrointestinal: No abdominal pain.  No nausea, no vomiting.  No diarrhea.  No constipation. Genitourinary: positive for dysuria. Musculoskeletal: Negative for back pain. Skin: Negative for rash.  ____________________________________________   PHYSICAL EXAM:  VITAL SIGNS: ED Triage Vitals  Enc Vitals Group       BP 12/02/17 1038 (!) 154/75     Pulse Rate 12/02/17 1038 (!) 55     Resp 12/02/17 1038 18     Temp 12/02/17 1038 98.1 F (36.7 C)     Temp Source 12/02/17 1038 Oral     SpO2 12/02/17 1038 98 %     Weight 12/02/17 1039 152 lb (68.9 kg)     Height 12/02/17 1039 4\' 10"  (1.473 m)     Head Circumference --      Peak Flow --      Pain Score 12/02/17 1040 4     Pain Loc --      Pain Edu? --      Excl. in [place]? --     Constitutional: Alert and oriented. Well appearing and in no acute distress. Cardiovascular: Normal rate, regular rhythm. Grossly normal heart sounds.  Good peripheral circulation. Respiratory: Normal respiratory effort without tachypnea nor retractions. Breath sounds are clear and equal bilaterally. No wheezes, rales, rhonchi. Gastrointestinal: Soft and nontender.  No CVA tenderness. Musculoskeletal: No midline cervical, thoracic or lumbar tenderness to palpation.  Neurologic:  Normal speech and language.Speech is normal. No gait instability.  Skin:  Skin is warm, dry and intact. No rash noted. Psychiatric: Mood and affect are normal. Speech and behavior are normal. Patient exhibits appropriate insight and judgment   ___________________________________________   LABS (all labs ordered are listed, but only abnormal results are displayed)  Labs Reviewed  URINALYSIS, COMPLETE (UACMP) WITH MICROSCOPIC - Abnormal; Notable for the following components:      Result Value   Hgb urine dipstick TRACE (*)    Nitrite POSITIVE (*)    Leukocytes, UA MODERATE (*)    Squamous Epithelial / LPF 0-5 (*)    Bacteria, UA FEW (*)    All other components within normal limits  URINE CULTURE    PROCEDURES Procedures    INITIAL IMPRESSION / ASSESSMENT AND PLAN / ED COURSE  Pertinent labs & imaging results that were available during my care of the patient were reviewed by me and considered in my medical decision making (see chart for details).  Well-appearing patient.  No acute  distress.  Presented for evaluation of dysuria.  Urine will culture urine.  Will treat UTI with oral cephalexin.  Encouraged rest, fluids, supportive care, voiding when needed and monitoring incontinence pads.  Encouraged urology follow-up.Discussed indication, risks and benefits of medications with patient.  Discussed follow up with Primary care physician this week. Discussed follow up and return parameters including no resolution or any worsening concerns. Patient verbalized understanding and agreed to plan.   ____________________________________________   FINAL CLINICAL IMPRESSION(S) / ED DIAGNOSES  Final diagnoses:  Urinary tract infection with hematuria, site unspecified     ED Discharge Orders        Ordered    cephALEXin (KEFLEX) 500 MG capsule  2 times daily     12/02/17 1118       Note: This dictation was prepared with Dragon dictation along with smaller phrase technology. Any  transcriptional errors that result from this process are unintentional.         Marylene Land, NP 12/02/17 1144

## 2017-12-02 NOTE — ED Triage Notes (Signed)
Patient complains of urinary frequency, urgency, painful urination, incontinence. Patient states that this has occurred around 4 times this year.

## 2017-12-02 NOTE — Discharge Instructions (Signed)
Take medication as prescribed. Rest. Drink plenty of fluids.   Follow up with urology.  Follow up with your primary care physician this week as needed. Return to Urgent care for new or worsening concerns.

## 2017-12-04 ENCOUNTER — Inpatient Hospital Stay
Admission: RE | Admit: 2017-12-04 | Discharge: 2017-12-04 | Disposition: A | Discharge status: Home / Self Care | Payer: Self-pay | Source: Ambulatory Visit | Attending: *Deleted | Admitting: *Deleted

## 2017-12-04 ENCOUNTER — Other Ambulatory Visit: Payer: Self-pay | Admitting: *Deleted

## 2017-12-04 DIAGNOSIS — Z9289 Personal history of other medical treatment: Secondary | ICD-10-CM

## 2017-12-04 LAB — URINE CULTURE: Culture: >=100000 — AB

## 2018-02-21 ENCOUNTER — Ambulatory Visit
Admission: EM | Admit: 2018-02-21 | Discharge: 2018-02-21 | Disposition: A | Discharge status: Home / Self Care | Payer: Medicare Other | Attending: Emergency Medicine | Admitting: Emergency Medicine

## 2018-02-21 ENCOUNTER — Other Ambulatory Visit: Payer: Self-pay

## 2018-02-21 DIAGNOSIS — Z888 Allergy status to other drugs, medicaments and biological substances status: Secondary | ICD-10-CM | POA: Insufficient documentation

## 2018-02-21 DIAGNOSIS — Z79899 Other long term (current) drug therapy: Secondary | ICD-10-CM | POA: Diagnosis not present

## 2018-02-21 DIAGNOSIS — N3000 Acute cystitis without hematuria: Secondary | ICD-10-CM | POA: Insufficient documentation

## 2018-02-21 DIAGNOSIS — N898 Other specified noninflammatory disorders of vagina: Secondary | ICD-10-CM | POA: Diagnosis present

## 2018-02-21 LAB — URINALYSIS, COMPLETE (UACMP) WITH MICROSCOPIC
BACTERIA UA: NONE SEEN
Bilirubin Urine: NEGATIVE
Glucose, UA: NEGATIVE mg/dL
Hgb urine dipstick: NEGATIVE
Ketones, ur: NEGATIVE mg/dL
Nitrite: POSITIVE — AB
Protein, ur: NEGATIVE mg/dL
RBC / HPF: NONE SEEN RBC/hpf (ref 0–5)
pH: 6 (ref 5.0–8.0)

## 2018-02-21 LAB — WET PREP, GENITAL
CLUE CELLS WET PREP: NONE SEEN
SPERM: NONE SEEN
TRICH WET PREP: NONE SEEN
YEAST WET PREP: NONE SEEN

## 2018-02-21 MED ORDER — PHENAZOPYRIDINE HCL 200 MG PO TABS: 200.0000 mg | ORAL_TABLET | Freq: Three times a day (TID) | ORAL | 0 refills | Status: AC | PRN

## 2018-02-21 MED ORDER — CEPHALEXIN 500 MG PO CAPS: 500.0000 mg | ORAL_CAPSULE | Freq: Two times a day (BID) | ORAL | 0 refills | Status: DC

## 2018-02-21 NOTE — ED Provider Notes (Signed)
HPI  SUBJECTIVE:  Mckenzie Norton is a 78 y.o. female who presents with vaginal irritation, urinary urgency, frequency starting yesterday.  She has an ongoing issue with urinary incontinence and states that she wears a pad.  She denies dysuria, cloudy odorous urine, hematuria.  No fevers, vomiting, abdominal, back, pelvic pain.  No vaginal itching, vulvar burning, swelling, rash.  She reports intermittent odor for "a while".  States is not necessarily associated with these symptoms today.  No vaginal discharge.  No recent antibiotics.  She is not sexually active with her husband.  She took Tylenol within 6-8 hours prior to evaluation.  She tried Azo and increasing fluids with improvement in her symptoms.  No aggravating factors.  She has a past medical history of UTIs, remote history of vaginal yeast infections, central retinal venous occlusion, hypertension.  She takes aspirin daily.  No history of pyelonephritis, nephrolithiasis, gonorrhea, chlamydia, HIV, HSV, syphilis, Trichomonas, BV.  No history of diabetes.  WER:[id number], Lourdes Sledge, MD  Last 2 UTIs grew out E. coli.  The last one was pansensitive.  She was treated with Keflex on 12/02/2017.   Past Medical History:  Diagnosis Date  . Cancer ([place]) 2015   lung ca  . 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 GERD (gastroesophageal reflux disease)   . Hypertension   . Hypothyroidism   . 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 Solitary pulmonary nodule     Past Surgical History:  Procedure Laterality Date  . BREAST BIOPSY Left 1990   neg  . COLONOSCOPY    . COLONOSCOPY WITH PROPOFOL N/A 05/10/2016   Procedure: COLONOSCOPY WITH PROPOFOL;  Surgeon: Manya Silvas, MD;  Location: [facility] ENDOSCOPY;  Service: Endoscopy;  Laterality: N/A;  . ESOPHAGOGASTRODUODENOSCOPY    . VIDEO ASSISTED THORACOSCOPY (VATS)/WEDGE RESECTION Left 2014   mature carcinoid tumor of the LUL    Family History  Problem  Relation Age of Onset  . Lung cancer Mother   . Heart disease Mother   . Breast cancer Neg Hx     Social History   Tobacco Use  . Smoking status: Never Smoker  . Smokeless tobacco: Never Used  Substance Use Topics  . Alcohol use: Yes    Alcohol/week: 0.0 oz    Frequency: Never    Comment: rare  . Drug use: No    No current facility-administered medications for this encounter.   Current Outpatient Medications:  .  aspirin 325 MG tablet, Take 325 mg by mouth daily., Disp: , Rfl:  .  Calcium Carbonate-Vitamin D (CALTRATE 600+D PO), Take 600 mg by mouth 3 (three) times daily., Disp: , Rfl:  .  esomeprazole (NEXIUM) 40 MG capsule, Take 40 mg by mouth daily at 12 noon., Disp: , Rfl:  .  levothyroxine (SYNTHROID, LEVOTHROID) 75 MCG tablet, Take 75 mcg by mouth daily before breakfast., Disp: , Rfl:  .  loratadine (CLARITIN) 10 MG tablet, Take 10 mg by mouth daily., Disp: , Rfl:  .  losartan-hydrochlorothiazide (HYZAAR) 50-12.5 MG tablet, Take 1 tablet by mouth daily., Disp: , Rfl:  .  metoprolol succinate (TOPROL-XL) 25 MG 24 hr tablet, Take 12.5 mg by mouth daily. , Disp: , Rfl:  .  Multiple Vitamins-Minerals (CENTRUM SILVER PO), Take by mouth., Disp: , Rfl:  .  omega-3 acid ethyl esters (LOVAZA) 1 g capsule, Take 1 g by mouth 2 (two) times daily., Disp: , Rfl:  .  rosuvastatin (CRESTOR) 10 MG  tablet, Take 10 mg by mouth daily., Disp: , Rfl:  .  cephALEXin (KEFLEX) 500 MG capsule, Take 1 capsule (500 mg total) by mouth 2 (two) times daily., Disp: 14 capsule, Rfl: 0  Allergies  Allergen Reactions  . Levofloxacin     dizziness  . Adhesive [Tape] Dermatitis and Rash    Blisters to skin     ROS  As noted in HPI.   Physical Exam  BP 137/82 (BP Location: Left Arm)   Pulse 69   Temp 97.6 F (36.4 C) (Oral)   Resp 18   Ht 4\' 11"  (1.499 m)   Wt 150 lb (68 kg)   SpO2 96%   BMI 30.30 kg/m   Constitutional: Well developed, well nourished, no acute distress Eyes:  EOMI,  conjunctiva normal bilaterally HENT: Normocephalic, atraumatic,mucus membranes moist Respiratory: Normal inspiratory effort Cardiovascular: Normal rate GI: nondistended, soft.  No suprapubic or flank tenderness Back: No CVAT skin: No rash, skin intact Musculoskeletal: no deformities Neurologic: Alert & oriented x 3, no focal neuro deficits Psychiatric: Speech and behavior appropriate   ED Course   Medications - No data to display  Orders Placed This Encounter  Procedures  . Urine culture    Standing Status:   Standing    Number of Occurrences:   1    Order Specific Question:   List patient's active antibiotics    Answer:   keflex  . Wet prep, genital    Standing Status:   Standing    Number of Occurrences:   1  . Urinalysis, Complete w Microscopic    Standing Status:   Standing    Number of Occurrences:   1    Results for orders placed or performed during the hospital encounter of 02/21/18 (from the past 24 hour(s))  Urinalysis, Complete w Microscopic     Status: Abnormal   Collection Time: 02/21/18  1:45 PM  Result Value Ref Range   Color, Urine YELLOW YELLOW   APPearance CLEAR CLEAR   Specific Gravity, Urine <1.005 (L) 1.005 - 1.030   pH 6.0 5.0 - 8.0   Glucose, UA NEGATIVE NEGATIVE mg/dL   Hgb urine dipstick NEGATIVE NEGATIVE   Bilirubin Urine NEGATIVE NEGATIVE   Ketones, ur NEGATIVE NEGATIVE mg/dL   Protein, ur NEGATIVE NEGATIVE mg/dL   Nitrite POSITIVE (A) NEGATIVE   Leukocytes, UA MODERATE (A) NEGATIVE   Squamous Epithelial / LPF 0-5 (A) NONE SEEN   WBC, UA 6-30 0 - 5 WBC/hpf   RBC / HPF NONE SEEN 0 - 5 RBC/hpf   Bacteria, UA NONE SEEN NONE SEEN   WBC Clumps PRESENT   Wet prep, genital     Status: Abnormal   Collection Time: 02/21/18  2:18 PM  Result Value Ref Range   Yeast Wet Prep HPF POC NONE SEEN NONE SEEN   Trich, Wet Prep NONE SEEN NONE SEEN   Clue Cells Wet Prep HPF POC NONE SEEN NONE SEEN   WBC, Wet Prep HPF POC FEW (A) NONE SEEN   Sperm NONE  SEEN    No results found.  ED Clinical Impression  Acute cystitis without hematuria   ED Assessment/Plan  Previous labs, records reviewed as noted in HPI.  UA is consistent with a UTI today.  Sent This off for culture to confirm antibiotic choice.  Will send home on Keflex, Pyridium.  Also checking a wet prep as patient reports occasional vaginal odor and vaginal irritation.  Wet prep negative for yeast, BV, trichomonas.  Plan as above.  Discussed labs,  MDM, plan and followup with patient .patient agrees with plan.   Meds ordered this encounter  Medications  . cephALEXin (KEFLEX) 500 MG capsule    Sig: Take 1 capsule (500 mg total) by mouth 2 (two) times daily.    Dispense:  14 capsule    Refill:  0    *This clinic note was created using [employer]. Therefore, there may be occasional mistakes despite careful proofreading.   ?   Melynda Ripple, MD 02/21/18 1538

## 2018-02-21 NOTE — ED Triage Notes (Signed)
Patient complains of urinary frequency, urgency that started yesterday. Patient states that she has noticed burning. Patient would like to know why she is getting these so frequently. Patient states that she has been taking Claritin D and is concerned that is causing her symptoms. Patient last culture grew out E. Coli.

## 2018-02-21 NOTE — Discharge Instructions (Signed)
Your wet prep was negative for yeast and bacterial vaginosis, so this is not the cause of your symptoms.  Initial Keflex even if you feel better.  Follow-up with your primary care physician if you do not get better, go to the ER for fevers above 100.4, abdominal pain, back pain, or other concerns.

## 2018-02-24 LAB — URINE CULTURE: Culture: 10000 — AB

## 2018-11-11 ENCOUNTER — Other Ambulatory Visit: Payer: Self-pay | Admitting: Obstetrics and Gynecology

## 2018-11-11 DIAGNOSIS — Z1231 Encounter for screening mammogram for malignant neoplasm of breast: Secondary | ICD-10-CM

## 2018-12-04 ENCOUNTER — Ambulatory Visit
Admission: RE | Admit: 2018-12-04 | Discharge: 2018-12-04 | Disposition: A | Discharge status: Home / Self Care | Payer: Medicare Other | Source: Ambulatory Visit | Attending: Obstetrics and Gynecology | Admitting: Obstetrics and Gynecology

## 2018-12-04 ENCOUNTER — Encounter (INDEPENDENT_AMBULATORY_CARE_PROVIDER_SITE_OTHER): Payer: Self-pay

## 2018-12-04 DIAGNOSIS — Z1231 Encounter for screening mammogram for malignant neoplasm of breast: Secondary | ICD-10-CM | POA: Diagnosis not present

## 2020-01-18 ENCOUNTER — Other Ambulatory Visit: Payer: Self-pay | Admitting: Internal Medicine

## 2020-01-18 DIAGNOSIS — Z1231 Encounter for screening mammogram for malignant neoplasm of breast: Secondary | ICD-10-CM

## 2020-01-21 ENCOUNTER — Ambulatory Visit: Payer: Medicare Other

## 2020-01-28 ENCOUNTER — Other Ambulatory Visit: Payer: Self-pay | Admitting: Internal Medicine

## 2020-01-28 DIAGNOSIS — C7A090 Malignant carcinoid tumor of the bronchus and lung: Secondary | ICD-10-CM

## 2020-02-03 ENCOUNTER — Ambulatory Visit
Admission: RE | Admit: 2020-02-03 | Discharge: 2020-02-03 | Disposition: A | Discharge status: Home / Self Care | Payer: Medicare Other | Source: Ambulatory Visit | Attending: Internal Medicine | Admitting: Internal Medicine

## 2020-02-03 ENCOUNTER — Other Ambulatory Visit: Payer: Self-pay

## 2020-02-03 DIAGNOSIS — C7A090 Malignant carcinoid tumor of the bronchus and lung: Secondary | ICD-10-CM | POA: Insufficient documentation

## 2020-02-03 DIAGNOSIS — Z1231 Encounter for screening mammogram for malignant neoplasm of breast: Secondary | ICD-10-CM | POA: Diagnosis present

## 2020-02-08 ENCOUNTER — Other Ambulatory Visit: Payer: Self-pay | Admitting: Internal Medicine

## 2020-02-08 DIAGNOSIS — Z859 Personal history of malignant neoplasm, unspecified: Secondary | ICD-10-CM

## 2020-02-08 DIAGNOSIS — K7689 Other specified diseases of liver: Secondary | ICD-10-CM

## 2020-02-17 ENCOUNTER — Other Ambulatory Visit: Payer: Self-pay

## 2020-02-17 ENCOUNTER — Ambulatory Visit
Admission: RE | Admit: 2020-02-17 | Discharge: 2020-02-17 | Disposition: A | Discharge status: Home / Self Care | Payer: Medicare Other | Source: Ambulatory Visit | Attending: Internal Medicine | Admitting: Internal Medicine

## 2020-02-17 DIAGNOSIS — Z859 Personal history of malignant neoplasm, unspecified: Secondary | ICD-10-CM | POA: Diagnosis present

## 2020-02-17 DIAGNOSIS — K7689 Other specified diseases of liver: Secondary | ICD-10-CM

## 2020-02-17 MED ORDER — GADOBUTROL 1 MMOL/ML IV SOLN
6.0000 mL | Freq: Once | INTRAVENOUS | Status: AC | PRN
  Administered 2020-02-17: 13:00:00 6 mL via INTRAVENOUS

## 2021-02-06 ENCOUNTER — Other Ambulatory Visit: Payer: Self-pay | Admitting: Internal Medicine

## 2021-02-06 DIAGNOSIS — Z1231 Encounter for screening mammogram for malignant neoplasm of breast: Secondary | ICD-10-CM

## 2021-02-13 ENCOUNTER — Other Ambulatory Visit: Payer: Self-pay

## 2021-02-13 ENCOUNTER — Ambulatory Visit
Admission: RE | Admit: 2021-02-13 | Discharge: 2021-02-13 | Disposition: A | Discharge status: Home / Self Care | Payer: Medicare Other | Source: Ambulatory Visit | Attending: Internal Medicine | Admitting: Internal Medicine

## 2021-02-13 DIAGNOSIS — Z1231 Encounter for screening mammogram for malignant neoplasm of breast: Secondary | ICD-10-CM | POA: Diagnosis present

## 2021-09-21 ENCOUNTER — Other Ambulatory Visit: Payer: Self-pay

## 2021-09-21 ENCOUNTER — Emergency Department: Payer: Medicare Other

## 2021-09-21 ENCOUNTER — Emergency Department
Admission: EM | Admit: 2021-09-21 | Discharge: 2021-09-21 | Disposition: A | Discharge status: Home / Self Care | Payer: Medicare Other | Attending: Emergency Medicine | Admitting: Emergency Medicine

## 2021-09-21 DIAGNOSIS — Z79899 Other long term (current) drug therapy: Secondary | ICD-10-CM | POA: Insufficient documentation

## 2021-09-21 DIAGNOSIS — Z85118 Personal history of other malignant neoplasm of bronchus and lung: Secondary | ICD-10-CM | POA: Diagnosis not present

## 2021-09-21 DIAGNOSIS — I10 Essential (primary) hypertension: Secondary | ICD-10-CM | POA: Insufficient documentation

## 2021-09-21 DIAGNOSIS — S01511A Laceration without foreign body of lip, initial encounter: Secondary | ICD-10-CM | POA: Diagnosis not present

## 2021-09-21 DIAGNOSIS — Z23 Encounter for immunization: Secondary | ICD-10-CM | POA: Insufficient documentation

## 2021-09-21 DIAGNOSIS — Z7982 Long term (current) use of aspirin: Secondary | ICD-10-CM | POA: Diagnosis not present

## 2021-09-21 DIAGNOSIS — Y92480 Sidewalk as the place of occurrence of the external cause: Secondary | ICD-10-CM | POA: Diagnosis not present

## 2021-09-21 DIAGNOSIS — E039 Hypothyroidism, unspecified: Secondary | ICD-10-CM | POA: Insufficient documentation

## 2021-09-21 DIAGNOSIS — W19XXXA Unspecified fall, initial encounter: Secondary | ICD-10-CM

## 2021-09-21 DIAGNOSIS — W010XXA Fall on same level from slipping, tripping and stumbling without subsequent striking against object, initial encounter: Secondary | ICD-10-CM | POA: Insufficient documentation

## 2021-09-21 DIAGNOSIS — S0181XA Laceration without foreign body of other part of head, initial encounter: Secondary | ICD-10-CM | POA: Insufficient documentation

## 2021-09-21 MED ORDER — TETANUS-DIPHTH-ACELL PERTUSSIS 5-2.5-18.5 LF-MCG/0.5 IM SUSY
0.5000 mL | PREFILLED_SYRINGE | Freq: Once | INTRAMUSCULAR | Status: AC
  Administered 2021-09-21: 0.5 mL via INTRAMUSCULAR
  Filled 2021-09-21: qty 0.5

## 2021-09-21 NOTE — ED Triage Notes (Signed)
Pt to ER via [facility] after tripping and falling down the curb today, denies LOC. Hit the front of her lip, has small laceration present to top lip.  Denies blood thinner usage.

## 2021-09-21 NOTE — ED Provider Notes (Signed)
Emergency Medicine Provider Triage Evaluation Note  Mckenzie Norton , a 81 y.o. female  was evaluated in triage.  Pt complains of fall, lip laceration.  Patient states that she was going into her dermatology appointment, her mask started running up and obstructed part of her vision to the point she did not see the curb.  Patient tripped over a curb, fell landing face down.  Patient had an unprotected fall and did strike her face.  No loss of consciousness.  She denies any headache, visual changes, neck pain at this time.  Patient has what appears to be a through and through laceration to her upper lip with a laceration on the external surface of the lip above the vermilion border corresponding with a laceration on the inner lip.  No active bleeding at this time.  Pain is minimal at this time.  No other complaint at this time..  Review of Systems  Positive: Fall, facial/lip laceration Negative: No loss of consciousness, headache, vision changes, radicular symptoms in the upper or lower extremities  Physical Exam  BP (!) 148/92 (BP Location: Left Arm)   Pulse (!) 59   Temp 97.8 F (36.6 C) (Oral)   Resp 18   Ht 4\' 11"  (1.499 m)   Wt 63.5 kg   SpO2 99%   BMI 28.28 kg/m  Gen:   Awake, no distress   Resp:  Normal effort  MSK:   Moves extremities without difficulty  Other:  Patient has lip laceration to the upper lip.  There is a small laceration along the external lip above the vermilion border as well as a corresponding laceration in the inner portion of her lip.  This makes a flap.  No active bleeding.  There is no tenderness over the facial structures at this time.  No palpable abnormality or crepitus.  Cranial nerves intact  Medical Decision Making  Medically screening exam initiated at 1:22 PM.  Appropriate orders placed.  Mckenzie Norton was informed that the remainder of the evaluation will be completed by another provider, this initial triage assessment does not replace that evaluation,  and the importance of remaining in the ED until their evaluation is complete.  Fall, lip laceration.  Patient presented to the emergency department after tripping and falling and hitting her face.  Patient has a laceration to the upper lip that is through from the inner portion of the lip to the outer lip.  Patient with no loss of consciousness.  Neurologically intact at this time.  Patient will have imaging to ensure no underlying injury.  Will need stitches to this lip.  Tetanus shot will be updated at this time   Brynda Peon 09/21/21 1328    Blake Divine, MD 09/21/21 1710

## 2021-09-21 NOTE — ED Notes (Signed)
Verified correct patient and correct discharge papers given. Pt alert and oriented X 4, stable for discharge. RR even and unlabored, color WNL. Discussed discharge instructions and follow-up as directed. Discharge medications discussed, when prescribed. Pt had opportunity to ask questions, and RN available to provide patient and/or family education.   

## 2021-09-21 NOTE — ED Provider Notes (Signed)
[facility]-EMERGENCY DEPARTMENT  ____________________________________________  Time seen: Approximately 4:36 PM  I have reviewed the triage vital signs and the nursing notes.   HISTORY  Chief Complaint Fall   Historian Patient    HPI Mckenzie Norton is a 81 y.o. female presents to the emergency department after patient had a mechanical fall while going to a dermatology appointment.  Patient tripped over a curb and landed on her face.  Patient denies loss of consciousness.  No neck pain or numbness or tingling in the upper and lower extremities.  Patient has a superficial laceration along upper lip that does not communicate with internal upper lip laceration.  No chest pain, chest tightness or abdominal pain.  No other alleviating measures attempted.   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   Past Surgical History:  Procedure Laterality Date   BREAST BIOPSY Left 1990   neg   COLONOSCOPY     COLONOSCOPY WITH PROPOFOL N/A 05/10/2016   Procedure: COLONOSCOPY WITH PROPOFOL;  Surgeon: Manya Silvas, MD;  Location: [facility] ENDOSCOPY;  Service: Endoscopy;  Laterality: N/A;   ESOPHAGOGASTRODUODENOSCOPY     VIDEO ASSISTED THORACOSCOPY (VATS)/WEDGE RESECTION Left 2014   mature  carcinoid tumor of the LUL    Prior to Admission medications   Medication Sig Start Date End Date Taking? Authorizing Provider  aspirin 325 MG tablet Take 325 mg by mouth daily.    [provider]  Calcium Carbonate-Vitamin D (CALTRATE 600+D PO) Take 600 mg by mouth 3 (three) times daily.    [provider]  cephALEXin (KEFLEX) 500 MG capsule Take 1 capsule (500 mg total) by mouth 2 (two) times daily. 02/21/18   Melynda Ripple, MD  esomeprazole (NEXIUM) 40 MG capsule Take 40 mg by mouth daily at 12 noon.    [provider]  levothyroxine (SYNTHROID, LEVOTHROID) 75 MCG tablet Take 75 mcg by mouth daily before breakfast.    [provider]  loratadine (CLARITIN) 10 MG tablet Take 10 mg by mouth daily.    [provider]  losartan-hydrochlorothiazide (HYZAAR) 50-12.5 MG tablet Take 1 tablet by mouth daily.    [provider]  metoprolol succinate (TOPROL-XL) 25 MG 24 hr tablet Take 12.5 mg by mouth daily.     [provider]  Multiple Vitamins-Minerals (CENTRUM SILVER PO) Take by mouth.    [provider]  omega-3 acid ethyl esters (LOVAZA) 1 g capsule Take 1 g by mouth 2 (two) times daily.    [provider]  phenazopyridine (PYRIDIUM) 200 MG tablet Take 1 tablet (200 mg total) by mouth 3 (three) times daily as needed for pain. 02/21/18  Melynda Ripple, MD  rosuvastatin (CRESTOR) 10 MG tablet Take 10 mg by mouth daily.    [provider]    Allergies Levofloxacin and Adhesive [tape]  Family History  Problem Relation Age of Onset   Lung cancer Mother    Heart disease Mother    Breast cancer Neg Hx     Social History Social History   Tobacco Use   Smoking status: Never   Smokeless tobacco: Never  Vaping Use   Vaping Use: Never used  Substance Use Topics   Alcohol use: Yes    Alcohol/week: 0.0 standard drinks    Comment: rare   Drug use: No     Review of Systems  Constitutional: No  fever/chills Eyes:  No discharge ENT: No upper respiratory complaints. Respiratory: no cough. No SOB/ use of accessory muscles to breath Gastrointestinal:   No nausea, no vomiting.  No diarrhea.  No constipation. Musculoskeletal: Negative for musculoskeletal pain. Skin: Patient has laceration.     ____________________________________________   PHYSICAL EXAM:  VITAL SIGNS: ED Triage Vitals  Enc Vitals Group     BP 09/21/21 1314 (!) 148/92     Pulse Rate 09/21/21 1314 (!) 59     Resp 09/21/21 1314 18     Temp 09/21/21 1314 97.8 F (36.6 C)     Temp Source 09/21/21 1314 Oral     SpO2 09/21/21 1314 99 %     Weight 09/21/21 1317 140 lb (63.5 kg)     Height 09/21/21 1317 4\' 11"  (1.499 m)     Head Circumference --      Peak Flow --      Pain Score 09/21/21 1324 0     Pain Loc --      Pain Edu? --      Excl. in [place]? --      Constitutional: Alert and oriented. Well appearing and in no acute distress. Eyes: Conjunctivae are normal. PERRL. EOMI. Head: Atraumatic. ENT:      Nose: No congestion/rhinnorhea.      Mouth/Throat: Mucous membranes are moist.  Neck: No stridor.  No cervical spine tenderness to palpation. Cardiovascular: Normal rate, regular rhythm. Normal S1 and S2.  Good peripheral circulation. Respiratory: Normal respiratory effort without tachypnea or retractions. Lungs CTAB. Good air entry to the bases with no decreased or absent breath sounds Gastrointestinal: Bowel sounds x 4 quadrants. Soft and nontender to palpation. No guarding or rigidity. No distention. Musculoskeletal: Full range of motion to all extremities. No obvious deformities noted Neurologic:  Normal for age. No gross focal neurologic deficits are appreciated.  Skin: Patient has superficial 1 cm laceration above vermilion border of upper lip.  Patient also has a small, 0.5 cm laceration of inner oral mucosa of the upper lip that does not communicate with exterior laceration.  Laceration is too small to  form fluid pocket. Psychiatric: Mood and affect are normal for age. Speech and behavior are normal.   ____________________________________________   LABS (all labs ordered are listed, but only abnormal results are displayed)  Labs Reviewed - No data to display ____________________________________________  EKG   ____________________________________________  RADIOLOGY Unk Pinto, personally viewed and evaluated these images (plain radiographs) as part of my medical decision making, as well as reviewing the written report by the radiologist.  [place] (5MM)  Result Date: 09/21/2021 CLINICAL DATA:  Polytrauma, critical, head/C-spine injury suspected fall, hit head and face, lip laceration; Polytrauma, critical, head/C-spine injury suspected; Facial trauma EXAM:  CT HEAD WITHOUT CONTRAST CT MAXILLOFACIAL WITHOUT CONTRAST CT CERVICAL SPINE WITHOUT CONTRAST TECHNIQUE: Multidetector CT imaging of the head, cervical spine, and maxillofacial structures were performed using the standard protocol without intravenous contrast. Multiplanar CT image reconstructions of the cervical spine and maxillofacial structures were also generated. COMPARISON:  None. FINDINGS: CT HEAD FINDINGS Brain: No evidence of acute infarction, hemorrhage, hydrocephalus, extra-axial collection or mass lesion/mass effect. Vascular: Atherosclerotic calcifications involving the large vessels of the skull base. No unexpected hyperdense vessel. Skull: Normal. Negative for fracture or focal lesion. Other: Negative for scalp hematoma. CT MAXILLOFACIAL FINDINGS Osseous: No acute maxillofacial bone fracture. Bony orbital walls are intact. Mandible intact. Temporomandibular joints are aligned without dislocation. Degenerative arthropathy of the bilateral TMJs. Orbits: Negative. No traumatic or inflammatory finding. Sinuses: Minimal layering fluid in the left sphenoid sinus. Paranasal sinuses and mastoid air cells are otherwise  clear. Soft tissues: Negative for facial hematoma. CT CERVICAL SPINE FINDINGS Alignment: Facet joints are aligned without dislocation or traumatic listhesis. Dens and lateral masses are aligned. Straightening with slight reversal of the cervical lordosis. Skull base and vertebrae: No acute fracture. No primary bone lesion or focal pathologic process. Soft tissues and spinal canal: No prevertebral fluid or swelling. No visible canal hematoma. Disc levels: Advanced multilevel degenerative disc disease most pronounced from C3-4 through C6-7. Multilevel bilateral facet arthropathy is slightly more pronounced on the right. Multilevel uncovertebral arthropathy. Upper chest: Included lung apices are clear. Other: Aortic atherosclerosis. There is a medial course of the bilateral common carotid arteries, slightly more prevalent on the right. IMPRESSION: 1. No acute intracranial abnormality. 2. No acute maxillofacial bone fracture. 3. No acute fracture or traumatic listhesis of the cervical spine. 4. 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 Aortic Atherosclerosis (ICD10-I70.0). Electronically Signed   By: Davina Poke D.O.   On: 09/21/2021 14:19   CT Cervical Spine Wo Contrast  Result Date: 09/21/2021 CLINICAL DATA:  Polytrauma, critical, head/C-spine injury suspected fall, hit head and face, lip laceration; Polytrauma, critical, head/C-spine injury suspected; Facial trauma EXAM: CT HEAD WITHOUT CONTRAST CT MAXILLOFACIAL WITHOUT CONTRAST CT CERVICAL SPINE WITHOUT CONTRAST TECHNIQUE: Multidetector CT imaging of the head, cervical spine, and maxillofacial structures were performed using the standard protocol without intravenous contrast. Multiplanar CT image reconstructions of the cervical spine and maxillofacial structures were also generated. COMPARISON:  None. FINDINGS: CT HEAD FINDINGS Brain: No evidence of acute infarction, hemorrhage, hydrocephalus, extra-axial  collection or mass lesion/mass effect. Vascular: Atherosclerotic calcifications involving the large vessels of the skull base. No unexpected hyperdense vessel. Skull: Normal. Negative for fracture or focal lesion. Other: Negative for scalp hematoma. CT MAXILLOFACIAL FINDINGS Osseous: No acute maxillofacial bone fracture. Bony orbital walls are intact. Mandible intact. Temporomandibular joints are aligned without dislocation. Degenerative arthropathy of the bilateral TMJs. Orbits: Negative. No traumatic or inflammatory finding. Sinuses: Minimal layering fluid in the left sphenoid sinus. Paranasal sinuses and mastoid air cells are otherwise clear. Soft tissues: Negative for facial hematoma. CT CERVICAL SPINE FINDINGS Alignment: Facet joints are aligned without dislocation or traumatic listhesis. Dens and lateral masses are aligned. Straightening with slight reversal of the cervical lordosis. Skull base and vertebrae: No acute fracture. No primary bone lesion or focal pathologic process. Soft tissues and spinal canal: No prevertebral fluid or swelling. No visible canal hematoma. Disc levels: Advanced multilevel degenerative disc disease most pronounced from C3-4 through C6-7. Multilevel bilateral facet arthropathy is slightly more pronounced on the right. Multilevel uncovertebral arthropathy. Upper chest: Included lung  apices are clear. Other: Aortic atherosclerosis. There is a medial course of the bilateral common carotid arteries, slightly more prevalent on the right. IMPRESSION: 1. No acute intracranial abnormality. 2. No acute maxillofacial bone fracture. 3. No acute fracture or traumatic listhesis of the cervical spine. 4. 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 Aortic Atherosclerosis (ICD10-I70.0). Electronically Signed   By: Davina Poke D.O.   On: 09/21/2021 14:19   CT Maxillofacial Wo Contrast  Result Date: 09/21/2021 CLINICAL DATA:  Polytrauma,  critical, head/C-spine injury suspected fall, hit head and face, lip laceration; Polytrauma, critical, head/C-spine injury suspected; Facial trauma EXAM: CT HEAD WITHOUT CONTRAST CT MAXILLOFACIAL WITHOUT CONTRAST CT CERVICAL SPINE WITHOUT CONTRAST TECHNIQUE: Multidetector CT imaging of the head, cervical spine, and maxillofacial structures were performed using the standard protocol without intravenous contrast. Multiplanar CT image reconstructions of the cervical spine and maxillofacial structures were also generated. COMPARISON:  None. FINDINGS: CT HEAD FINDINGS Brain: No evidence of acute infarction, hemorrhage, hydrocephalus, extra-axial collection or mass lesion/mass effect. Vascular: Atherosclerotic calcifications involving the large vessels of the skull base. No unexpected hyperdense vessel. Skull: Normal. Negative for fracture or focal lesion. Other: Negative for scalp hematoma. CT MAXILLOFACIAL FINDINGS Osseous: No acute maxillofacial bone fracture. Bony orbital walls are intact. Mandible intact. Temporomandibular joints are aligned without dislocation. Degenerative arthropathy of the bilateral TMJs. Orbits: Negative. No traumatic or inflammatory finding. Sinuses: Minimal layering fluid in the left sphenoid sinus. Paranasal sinuses and mastoid air cells are otherwise clear. Soft tissues: Negative for facial hematoma. CT CERVICAL SPINE FINDINGS Alignment: Facet joints are aligned without dislocation or traumatic listhesis. Dens and lateral masses are aligned. Straightening with slight reversal of the cervical lordosis. Skull base and vertebrae: No acute fracture. No primary bone lesion or focal pathologic process. Soft tissues and spinal canal: No prevertebral fluid or swelling. No visible canal hematoma. Disc levels: Advanced multilevel degenerative disc disease most pronounced from C3-4 through C6-7. Multilevel bilateral facet arthropathy is slightly more pronounced on the right. Multilevel uncovertebral  arthropathy. Upper chest: Included lung apices are clear. Other: Aortic atherosclerosis. There is a medial course of the bilateral common carotid arteries, slightly more prevalent on the right. IMPRESSION: 1. No acute intracranial abnormality. 2. No acute maxillofacial bone fracture. 3. No acute fracture or traumatic listhesis of the cervical spine. 4. 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 Aortic Atherosclerosis (ICD10-I70.0). Electronically Signed   By: Davina Poke D.O.   On: 09/21/2021 14:19    ____________________________________________    PROCEDURES  Procedure(s) performed:     Marland KitchenMarland KitchenLaceration Repair  Date/Time: 09/21/2021 4:40 PM Performed by: Lannie Fields, PA-C Authorized by: Lannie Fields, PA-C   Consent:    Consent obtained:  Verbal   Risks discussed:  Infection and pain Universal protocol:    Procedure explained and questions answered to patient or proxy's satisfaction: yes     Patient identity confirmed:  Verbally with patient Anesthesia:    Anesthesia method:  None Laceration details:    Location:  Mouth   Length (cm):  1   Depth (mm):  5 Exploration:    Limited defect created (wound extended): no     Contaminated: no   Treatment:    Area cleansed with:  Saline   Amount of cleaning:  Standard   Debridement:  Minimal Skin repair:    Repair method:  Tissue adhesive Approximation:    Approximation:  Close Repair type:    Repair  type:  Simple Post-procedure details:    Procedure completion:  Tolerated well, no immediate complications     Medications  Tdap (BOOSTRIX) injection 0.5 mL (has no administration in time range)     ____________________________________________   INITIAL IMPRESSION / ASSESSMENT AND PLAN / ED COURSE  Pertinent labs & imaging results that were available during my care of the patient were reviewed by me and considered in my medical decision making (see chart for  details).      Assessment and plan Fall 81 year old female presents to the emergency department with a superficial upper lip laceration after falling while going to her doctor's appointment.  Vital signs are reassuring at triage.  On physical exam, patient was alert, active and nontoxic-appearing with no neurodeficits on exam.  CTs of the head, face and cervical spine showed no evidence of intracranial bleed, skull fracture or cervical spine fracture.  Patient's facial laceration was repaired in the emergency department using Dermabond.  Patient's tetanus status was updated prior to discharge and patient declined prescriptions for pain medicine and discharge stating that she would take Tylenol for discomfort.  Return precautions were given to return with new or worsening symptoms.  All patient questions were answered.     ____________________________________________  FINAL CLINICAL IMPRESSION(S) / ED DIAGNOSES  Final diagnoses:  Fall, initial encounter  Facial laceration, initial encounter      NEW MEDICATIONS STARTED DURING THIS VISIT:  ED Discharge Orders     None           This chart was dictated using voice recognition software/Dragon. Despite best efforts to proofread, errors can occur which can change the meaning. Any change was purely unintentional.     Lannie Fields, PA-C 09/21/21 1642    Vladimir Crofts, MD 09/21/21 [phone]

## 2021-09-21 NOTE — Discharge Instructions (Signed)
Please continue taking 650 mg of Tylenol every six hours as needed for pain.

## 2022-01-12 ENCOUNTER — Other Ambulatory Visit: Payer: Self-pay | Admitting: Internal Medicine

## 2022-01-12 DIAGNOSIS — Z1231 Encounter for screening mammogram for malignant neoplasm of breast: Secondary | ICD-10-CM

## 2022-02-08 ENCOUNTER — Other Ambulatory Visit: Payer: Self-pay | Admitting: Internal Medicine

## 2022-02-08 ENCOUNTER — Other Ambulatory Visit (HOSPITAL_COMMUNITY): Payer: Self-pay | Admitting: Internal Medicine

## 2022-02-08 DIAGNOSIS — K862 Cyst of pancreas: Secondary | ICD-10-CM

## 2022-02-12 ENCOUNTER — Other Ambulatory Visit: Payer: Self-pay | Admitting: Internal Medicine

## 2022-02-12 DIAGNOSIS — R911 Solitary pulmonary nodule: Secondary | ICD-10-CM

## 2022-02-20 ENCOUNTER — Ambulatory Visit
Admission: RE | Admit: 2022-02-20 | Discharge: 2022-02-20 | Disposition: A | Discharge status: Home / Self Care | Payer: Medicare Other | Source: Ambulatory Visit | Attending: Internal Medicine | Admitting: Internal Medicine

## 2022-02-20 ENCOUNTER — Other Ambulatory Visit: Payer: Self-pay

## 2022-02-20 DIAGNOSIS — Z1231 Encounter for screening mammogram for malignant neoplasm of breast: Secondary | ICD-10-CM | POA: Insufficient documentation

## 2022-02-23 ENCOUNTER — Ambulatory Visit
Admission: RE | Admit: 2022-02-23 | Discharge: 2022-02-23 | Disposition: A | Discharge status: Home / Self Care | Payer: Medicare Other | Source: Ambulatory Visit | Attending: Internal Medicine | Admitting: Internal Medicine

## 2022-02-23 ENCOUNTER — Other Ambulatory Visit: Payer: Self-pay

## 2022-02-23 DIAGNOSIS — K862 Cyst of pancreas: Secondary | ICD-10-CM | POA: Insufficient documentation

## 2022-02-23 MED ORDER — GADOBUTROL 1 MMOL/ML IV SOLN
6.0000 mL | Freq: Once | INTRAVENOUS | Status: AC | PRN
  Administered 2022-02-23: 6 mL via INTRAVENOUS

## 2022-02-26 ENCOUNTER — Ambulatory Visit
Admission: RE | Admit: 2022-02-26 | Discharge: 2022-02-26 | Disposition: A | Discharge status: Home / Self Care | Payer: Medicare Other | Source: Ambulatory Visit | Attending: Internal Medicine | Admitting: Internal Medicine

## 2022-02-26 ENCOUNTER — Other Ambulatory Visit: Payer: Self-pay

## 2022-02-26 DIAGNOSIS — R911 Solitary pulmonary nodule: Secondary | ICD-10-CM | POA: Diagnosis not present

## 2022-03-16 ENCOUNTER — Other Ambulatory Visit (HOSPITAL_COMMUNITY): Payer: Self-pay | Admitting: Internal Medicine

## 2022-03-16 DIAGNOSIS — K769 Liver disease, unspecified: Secondary | ICD-10-CM

## 2022-04-02 ENCOUNTER — Ambulatory Visit (HOSPITAL_COMMUNITY): Admission: RE | Admit: 2022-04-02 | Payer: Medicare Other | Source: Ambulatory Visit

## 2022-04-18 ENCOUNTER — Ambulatory Visit (HOSPITAL_COMMUNITY)
Admission: RE | Admit: 2022-04-18 | Discharge: 2022-04-18 | Disposition: A | Discharge status: Home / Self Care | Payer: Medicare Other | Source: Ambulatory Visit | Attending: Internal Medicine | Admitting: Internal Medicine

## 2022-04-18 DIAGNOSIS — K769 Liver disease, unspecified: Secondary | ICD-10-CM | POA: Diagnosis present

## 2022-04-18 DIAGNOSIS — Z85110 Personal history of malignant carcinoid tumor of bronchus and lung: Secondary | ICD-10-CM | POA: Insufficient documentation

## 2022-04-18 DIAGNOSIS — Z902 Acquired absence of lung [part of]: Secondary | ICD-10-CM | POA: Diagnosis not present

## 2022-04-18 MED ORDER — COPPER CU 64 DOTATATE 1 MCI/ML IV SOLN
4.0000 | Freq: Once | INTRAVENOUS | Status: AC
  Administered 2022-04-18: 4.27 via INTRAVENOUS

## 2022-12-06 IMAGING — CT NM PET SKULL BASE TO THIGH
7 series · 25 of 25 positions shown · non-contrast
Comparison: Abdominal MRI 02/23/2022, 02/17/2020

CLINICAL DATA: History of lung carcinoid. Status post lung
resection.

EXAM:
NUCLEAR MEDICINE PET SKULL BASE TO THIGH
TECHNIQUE: 4.3 mCi Leon 64 DOTATATE was injected intravenously. Full-ring PET
imaging was performed from the skull base to thigh after the
radiotracer. CT data was obtained and used for attenuation
correction and anatomic localization.

[Series 3: pet sk_thigh ac · axial · 5.0mm · 4.07mm/px · z∈[-872,-32]mm · 6 of 211 slices shown]
[im 1/211]
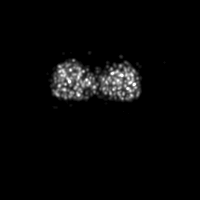
[im 43/211]
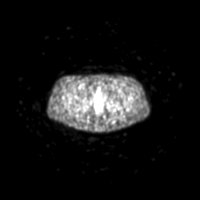
[im 85/211]
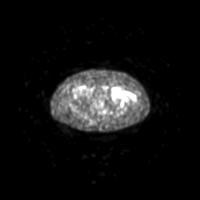
[im 127/211]
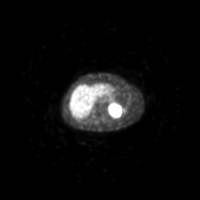
[im 169/211]
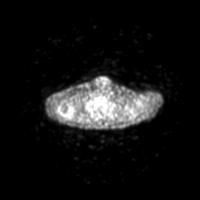
[im 211/211]
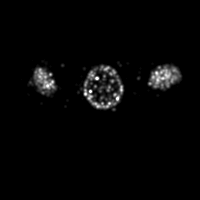

[Series 4: ct sk_thigh 5.0 br38 · axial · 5.0mm · 0.98mm/px · z∈[-872,-32]mm · 5 of 209 slices shown]
[im 1/209]
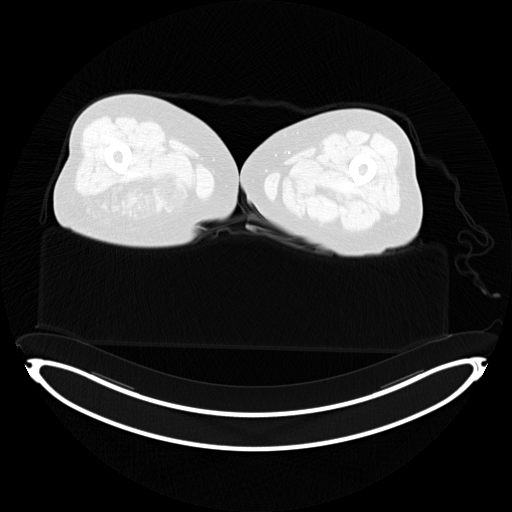
[im 53/209]
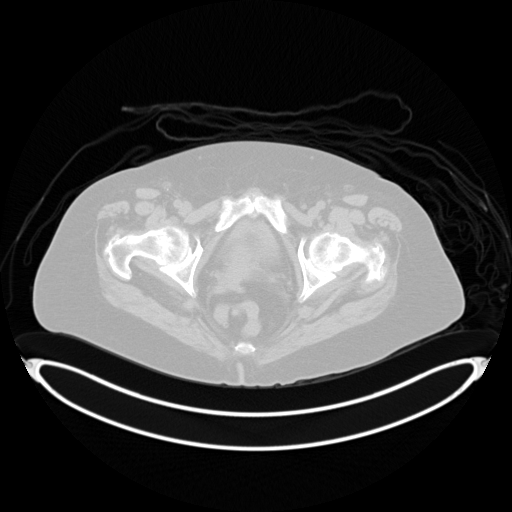
[im 105/209]
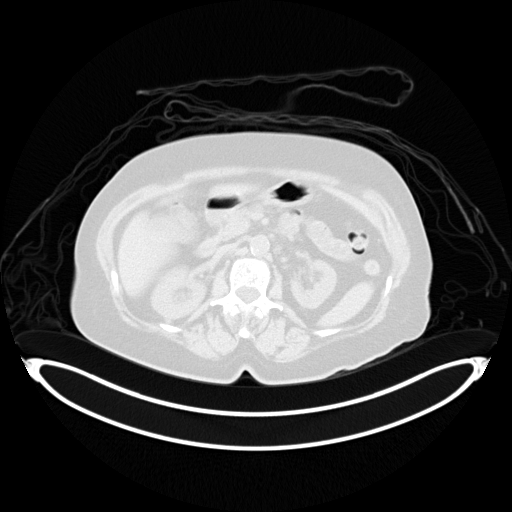
[im 157/209]
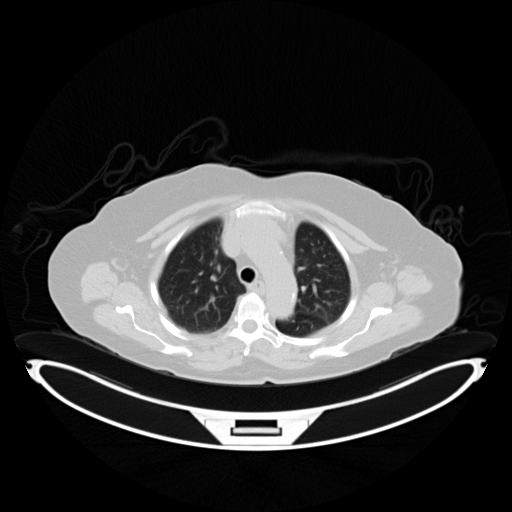
[im 209/209  brain]
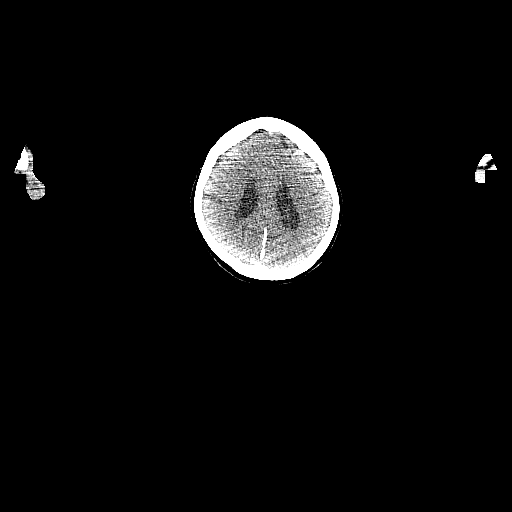

[Series 5: pet sk_thigh nac · axial · 5.0mm · 4.07mm/px · z∈[-872,-32]mm · 6 of 211 slices shown]
[im 1/211]
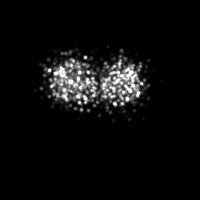
[im 43/211]
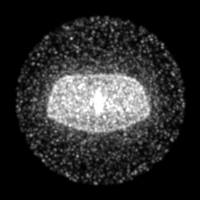
[im 85/211]
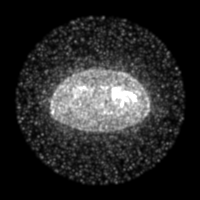
[im 127/211]
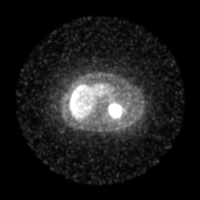
[im 169/211]
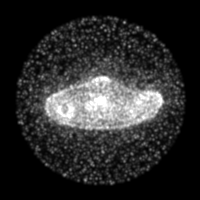
[im 211/211]
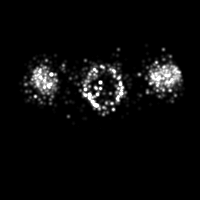

[Series 7: ct 5.0 bl57 lung_bone · axial · 5.0mm · 0.66mm/px · 1 of 53 slices shown]
[im 1/53]
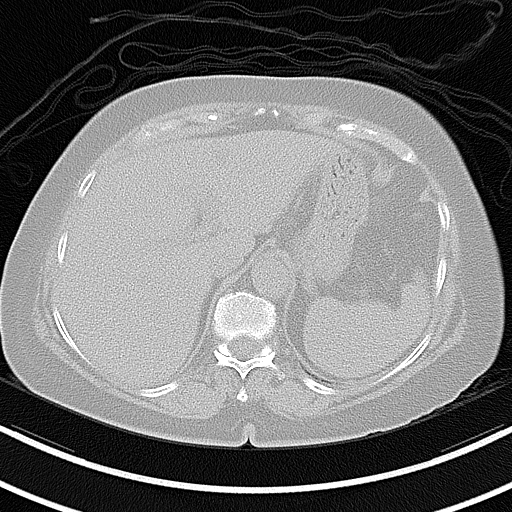

[Series 604: fused tra · 5 of 195 slices shown]
[im 1/195]
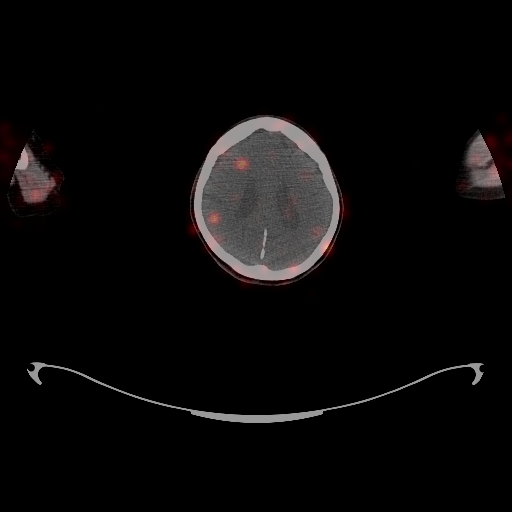
[im 49/195]
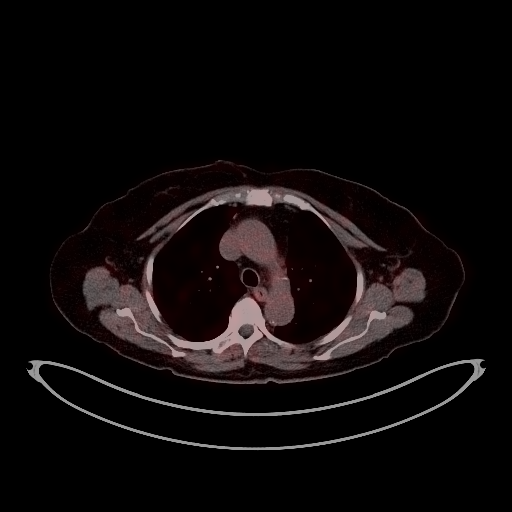
[im 98/195]
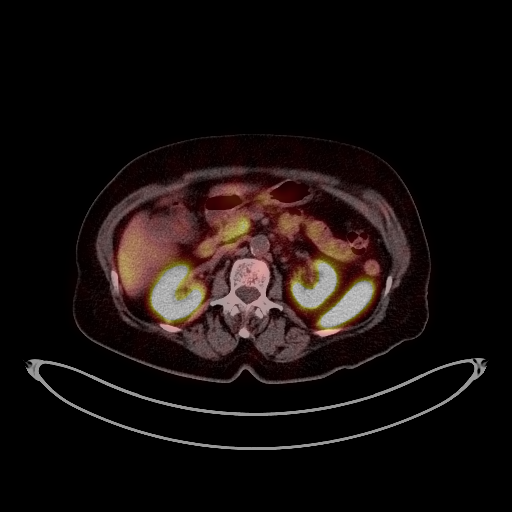
[im 146/195]
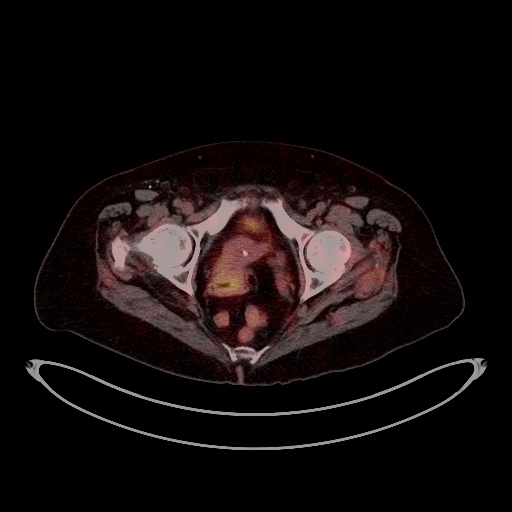
[im 195/195]
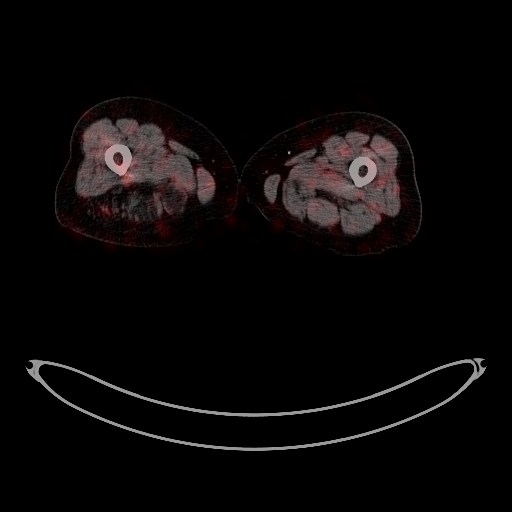

[Series 605: fused cor · 1 of 45 slices shown]
[im 1/45]
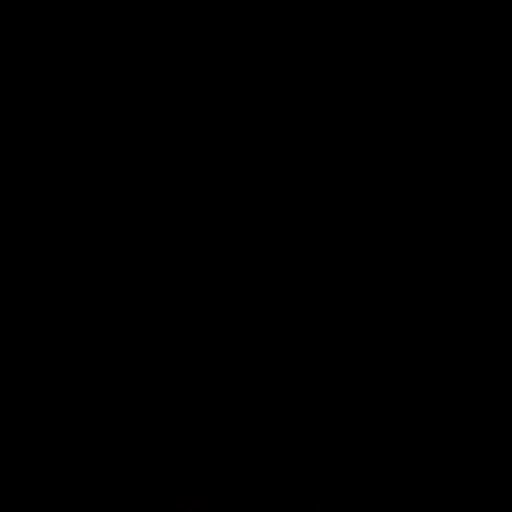

[Series 606: mip pet · coronal · 1.74mm/px · 1 of 32 slices shown]
[im 1/32]
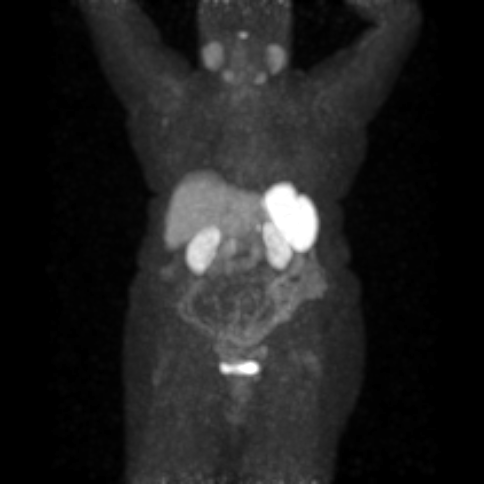

[25 of 25 positions shown; findings below may reference images not displayed]

FINDINGS: NECK

No radiotracer activity in neck lymph nodes.

Incidental CT findings: None

CHEST

No radiotracer accumulation within mediastinal or hilar lymph nodes.
No suspicious pulmonary nodules on the CT scan.

Incidental CT finding:Post LEFT upper lobe resection. Calcifications
along the resection scar. Radiotracer activity.

ABDOMEN/PELVIS

No focal radiotracer activity within the liver to correspond
enhancing lesion on comparison MRI.

There is enlarged periportal node/peripancreatic node measuring 11
mm short axis (image 104/4). Lymph node increased in size from
comparison MRI 02/17/2020 at which time it measures 7 mm. No
radiotracer activity associated with this enlarged node.

Intense radiotracer activity associated with the uncinate of
pancreas which is a known physiologic benign finding. Mild activity
in tail the pancreas is nonspecific.

No mesenteric radiotracer avid nodularity.  No pelvic adenopathy.

Physiologic activity noted in the liver, spleen, adrenal glands and
kidneys.

Incidental CT findings:Post hysterectomy.  Adnexa unremarkable

SKELETON

No focal activity to suggest skeletal metastasis.

Incidental CT findings:None
IMPRESSION: 1. No radiotracer activity associated with the liver. No evidence of
neuroendocrine tumor metastasis to the liver.
2. Enlarged lymph node adjacent to the pancreas / porta hepatis
enlarged from 7376 by without radiotracer activity. Indeterminate
finding.
3. No evidence of well differentiated neuroendocrine tumor
recurrence within the LEFT upper lobe.

## 2023-01-30 ENCOUNTER — Other Ambulatory Visit: Payer: Self-pay

## 2023-01-30 DIAGNOSIS — Z1231 Encounter for screening mammogram for malignant neoplasm of breast: Secondary | ICD-10-CM

## 2023-02-21 ENCOUNTER — Ambulatory Visit
Admission: RE | Admit: 2023-02-21 | Discharge: 2023-02-21 | Disposition: A | Discharge status: Home / Self Care | Payer: Medicare Other | Source: Ambulatory Visit | Attending: Internal Medicine | Admitting: Internal Medicine

## 2023-02-21 DIAGNOSIS — Z1231 Encounter for screening mammogram for malignant neoplasm of breast: Secondary | ICD-10-CM | POA: Diagnosis not present

## 2023-09-05 ENCOUNTER — Ambulatory Visit
Admission: EM | Admit: 2023-09-05 | Discharge: 2023-09-05 | Disposition: A | Discharge status: Home / Self Care | Payer: Medicare Other | Attending: Emergency Medicine | Admitting: Emergency Medicine

## 2023-09-05 ENCOUNTER — Ambulatory Visit (INDEPENDENT_AMBULATORY_CARE_PROVIDER_SITE_OTHER): Payer: Medicare Other

## 2023-09-05 DIAGNOSIS — M79672 Pain in left foot: Secondary | ICD-10-CM | POA: Diagnosis not present

## 2023-09-05 MED ORDER — PREDNISONE 20 MG PO TABS: 40.0000 mg | ORAL_TABLET | Freq: Every day | ORAL | 0 refills | Status: AC

## 2023-09-05 NOTE — ED Triage Notes (Signed)
Pt c/o L foot pain x7 days. States was working in the yard & pain started after.

## 2023-09-05 NOTE — Discharge Instructions (Addendum)
Did not appreciate any gross abnormality on your x-ray.  The formal radiologist read is pending at this time.  I will contact you if and only if your x-ray comes back abnormal.  If you do not hear from [place] by the end of the day, you can assume that your foot x-ray is normal.  Can also call here and get your results.  You can try topical Voltaren gel and 1000 mg of Tylenol 3 times a day.  Ice your foot after use.  Try heel cups.  Prednisone will help with pain.  Follow-up with the [facility] if no better in 1 week.

## 2023-09-05 NOTE — ED Provider Notes (Signed)
HPI  SUBJECTIVE:  Mckenzie Norton is a 83 y.o. female who presents with constant pain anterior to her left calcaneus for over a week starting after working in the yard.  States that she was wearing slip on shoes, and frequently slides in these shoes when her feet get wet.  She does not recall any trauma to the area, denies fevers, swelling, erythema, bruising, distal numbness or tingling, ankle pain.  No change in her physical activity.  She has not tried anything for this.  Symptoms are better with flexion/extension of her ankle with wearing supportive shoes, worse with direct palpation and with walking first thing in the morning. Patient has a past medical history of lung cancer, CRVO, GERD, hypertension, hypothyroidism.  No history of osteoporosis, diabetes, chronic kidney disease, history of left heel or foot injury. [facility]: [facility].  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   Solitary pulmonary nodule     Past Surgical History:  Procedure Laterality Date   BREAST BIOPSY Left 1990   neg   BREAST EXCISIONAL BIOPSY Left    COLONOSCOPY     COLONOSCOPY WITH PROPOFOL N/A 05/10/2016   Procedure: COLONOSCOPY WITH PROPOFOL;  Surgeon: Scot Jun, MD;  Location: [facility] ENDOSCOPY;  Service: Endoscopy;  Laterality: N/A;   ESOPHAGOGASTRODUODENOSCOPY     VIDEO ASSISTED THORACOSCOPY (VATS)/WEDGE RESECTION Left 2014   mature carcinoid tumor of the LUL    Family History  Problem Relation Age of Onset   Lung cancer Mother    Heart disease Mother    Breast cancer Neg Hx     Social History   Tobacco Use   Smoking status: Never   Smokeless tobacco: Never  Vaping Use   Vaping status: Never Used  Substance Use Topics   Alcohol  use: Yes    Alcohol/week: 0.0 standard drinks of alcohol    Comment: rare   Drug use: No    No current facility-administered medications for this encounter.  Current Outpatient Medications:    aspirin 325 MG tablet, Take 325 mg by mouth daily., Disp: , Rfl:    Calcium Carbonate-Vitamin D (CALTRATE 600+D PO), Take 600 mg by mouth 3 (three) times daily., Disp: , Rfl:    esomeprazole (NEXIUM) 40 MG capsule, Take 40 mg by mouth daily at 12 noon., Disp: , Rfl:    levothyroxine (SYNTHROID, LEVOTHROID) 75 MCG tablet, Take 75 mcg by mouth daily before breakfast., Disp: , Rfl:    loratadine (CLARITIN) 10 MG tablet, Take 10 mg by mouth daily., Disp: , Rfl:    losartan-hydrochlorothiazide (HYZAAR) 50-12.5 MG tablet, Take 1 tablet by mouth daily., Disp: , Rfl:    metoprolol succinate (TOPROL-XL) 25 MG 24 hr tablet, Take 12.5 mg by mouth daily. , Disp: , Rfl:    Multiple Vitamins-Minerals (CENTRUM SILVER PO), Take by mouth., Disp: , Rfl:    nystatin cream (MYCOSTATIN), Apply topically., Disp: , Rfl:    omega-3 acid ethyl esters (LOVAZA) 1 g capsule, Take 1 g by mouth 2 (two) times daily., Disp: , Rfl:    phenazopyridine (PYRIDIUM) 200 MG tablet, Take 1 tablet (200 mg total) by mouth 3 (three) times daily as needed for pain., Disp: 6 tablet,  Rfl: 0   predniSONE (DELTASONE) 20 MG tablet, Take 2 tablets (40 mg total) by mouth daily with breakfast for 5 days., Disp: 10 tablet, Rfl: 0   rosuvastatin (CRESTOR) 10 MG tablet, Take 10 mg by mouth daily., Disp: , Rfl:   Allergies  Allergen Reactions   Levofloxacin Other (See Comments)    dizziness  Dizziness  Dizziness  Dizziness  dizziness   Cetirizine Other (See Comments)    Nose bleed  Nose bleed  Nose bleed   Latex Other (See Comments)    Patient unsure of why this is in her chart   Adhesive [Tape] Dermatitis and Rash    Blisters to skin     ROS  As noted in HPI.   Physical Exam  BP 137/82 (BP Location: Right Arm)   Pulse (!) 54    Temp 98 F (36.7 C) (Oral)   Resp 16   Ht 4\' 11"  (1.499 m)   Wt 65.8 kg   SpO2 98%   BMI 29.29 kg/m   Constitutional: Well developed, well nourished, no acute distress Eyes:  EOMI, conjunctiva normal bilaterally HENT: Normocephalic, atraumatic,mucus membranes moist Respiratory: Normal inspiratory effort Cardiovascular: Normal rate GI: nondistended skin: No rash, skin intact Musculoskeletal: Left foot: Tenderness along the medial calcaneus and along the plantar aspect.  No bruising, swelling, erythema.  Midfoot NT. Base of fifth metatarsal NT.  Skin intact. DP 2+. Refill less than 2 seconds. Sensation grossly intact. Patient able to move all toes actively.  no pain with inversion / eversion,  dorsiflexion / plantarflexion. No Tenderness along the plantar fascia. Distal fibula NT, Medial malleolus NT,  Deltoid ligament NT, Lateral ligaments NT, Achilles NT. Patient able to bear weight while in department.  Neurologic: Alert & oriented x 3, no focal neuro deficits Psychiatric: Speech and behavior appropriate   ED Course   Medications - No data to display  Orders Placed This Encounter  Procedures   DG Os Calcis Left    Standing Status:   Standing    Number of Occurrences:   1    Order Specific Question:   Reason for Exam (SYMPTOM  OR DIAGNOSIS REQUIRED)    Answer:   Left medial calcaneus tenderness, no known trauma.  Rule out fracture    No results found for this or any previous visit (from the past 24 hour(s)). DG Os Calcis Left  Result Date: 09/05/2023 CLINICAL DATA:  Left medial calcaneal tenderness. No known injury. Patient reports foot pain for 7 days. EXAM: LEFT OS CALCIS - 2+ VIEW COMPARISON:  None Available. FINDINGS: Small plantar calcaneal spur. Small to moderate Achilles tendon enthesophyte. No fracture. No erosion or focal bone abnormality. Normal hindfoot and subtalar alignment. Unremarkable soft tissues. IMPRESSION: 1. No fracture. 2. Small plantar calcaneal spur. Small  to moderate Achilles tendon enthesophyte. Electronically Signed   By: Narda Rutherford M.D.   On: 09/05/2023 12:40    ED Clinical Impression  1. Pain of left heel      ED Assessment/Plan    Calulated creatinine clearance from outside labs done last month 44 mL/min.  eGFR 56 milliliters/minute/1.73 m  Will get calcaneus x-ray.  She has no ankle or foot tenderness.  Deferring complete imaging of the foot.   Reviewed imaging independently.  No fracture as read by me.  Formal radiology report pending at this time.  Small calcaneal spur per radiology.  This could be the cause of her symptoms.  No fracture.  This does not change plan.  Plan to send home with Voltaren topical gel-no renal dosing adjustment needed per up-to-date, Tylenol, 40 mg of prednisone for 5 days.  Ice, heel cups.  Follow-up with [facility] in [place] if no better in 1 week  Discussed imaging, MDM, treatment plan, and plan for follow-up with patient. Discussed sn/sx that should prompt return to the ED. patient agrees with plan.   Meds ordered this encounter  Medications   predniSONE (DELTASONE) 20 MG tablet    Sig: Take 2 tablets (40 mg total) by mouth daily with breakfast for 5 days.    Dispense:  10 tablet    Refill:  0      *This clinic note was created using Scientist, clinical (histocompatibility and immunogenetics). Therefore, there may be occasional mistakes despite careful proofreading.  ?    Domenick Gong, MD 09/06/23 [phone]

## 2024-01-29 ENCOUNTER — Other Ambulatory Visit: Payer: Self-pay | Admitting: Internal Medicine

## 2024-01-29 DIAGNOSIS — Z1231 Encounter for screening mammogram for malignant neoplasm of breast: Secondary | ICD-10-CM

## 2024-02-25 ENCOUNTER — Ambulatory Visit
Admission: RE | Admit: 2024-02-25 | Discharge: 2024-02-25 | Disposition: A | Discharge status: Home / Self Care | Payer: Medicare Other | Source: Ambulatory Visit | Attending: Internal Medicine | Admitting: Internal Medicine

## 2024-02-25 DIAGNOSIS — Z1231 Encounter for screening mammogram for malignant neoplasm of breast: Secondary | ICD-10-CM | POA: Diagnosis present
